# Patient Record
Sex: Female | Born: 1970
Health system: Southern US, Community
[De-identification: ages and names within clinical notes are randomized; demographics above are authoritative.]

## PROBLEM LIST (undated history)

## (undated) DIAGNOSIS — T7840XA Allergy, unspecified, initial encounter: Secondary | ICD-10-CM

## (undated) DIAGNOSIS — F1910 Other psychoactive substance abuse, uncomplicated: Secondary | ICD-10-CM

## (undated) DIAGNOSIS — F419 Anxiety disorder, unspecified: Secondary | ICD-10-CM

## (undated) DIAGNOSIS — E119 Type 2 diabetes mellitus without complications: Secondary | ICD-10-CM

## (undated) DIAGNOSIS — K311 Adult hypertrophic pyloric stenosis: Secondary | ICD-10-CM

## (undated) DIAGNOSIS — E669 Obesity, unspecified: Secondary | ICD-10-CM

## (undated) HISTORY — DX: Other psychoactive substance abuse, uncomplicated: F19.10

## (undated) HISTORY — DX: Anxiety disorder, unspecified: F41.9

## (undated) HISTORY — DX: Obesity, unspecified: E66.9

## (undated) HISTORY — DX: Allergy, unspecified, initial encounter: T78.40XA

## (undated) HISTORY — DX: Adult hypertrophic pyloric stenosis: K31.1

---
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
## 2002-05-28 ENCOUNTER — Encounter: Admission: RE | Admit: 2002-05-28 | Discharge: 2002-05-28 | Payer: Self-pay | Admitting: Family Medicine

## 2002-05-28 ENCOUNTER — Encounter: Payer: Self-pay | Admitting: Family Medicine

## 2002-05-31 ENCOUNTER — Encounter (INDEPENDENT_AMBULATORY_CARE_PROVIDER_SITE_OTHER): Payer: Self-pay | Admitting: Internal Medicine

## 2003-01-12 ENCOUNTER — Other Ambulatory Visit: Admission: RE | Admit: 2003-01-12 | Discharge: 2003-01-12 | Payer: Self-pay | Admitting: Internal Medicine

## 2005-01-02 ENCOUNTER — Ambulatory Visit: Payer: Self-pay | Admitting: Internal Medicine

## 2006-10-09 ENCOUNTER — Ambulatory Visit: Payer: Self-pay | Admitting: Internal Medicine

## 2006-10-22 ENCOUNTER — Encounter: Payer: Self-pay | Admitting: Internal Medicine

## 2007-05-26 ENCOUNTER — Telehealth (INDEPENDENT_AMBULATORY_CARE_PROVIDER_SITE_OTHER): Payer: Self-pay | Admitting: *Deleted

## 2007-11-13 ENCOUNTER — Ambulatory Visit: Payer: Self-pay | Admitting: Family Medicine

## 2007-11-13 DIAGNOSIS — F17210 Nicotine dependence, cigarettes, uncomplicated: Secondary | ICD-10-CM | POA: Insufficient documentation

## 2007-11-13 DIAGNOSIS — R062 Wheezing: Secondary | ICD-10-CM | POA: Insufficient documentation

## 2007-11-13 DIAGNOSIS — F17200 Nicotine dependence, unspecified, uncomplicated: Secondary | ICD-10-CM | POA: Insufficient documentation

## 2007-11-18 ENCOUNTER — Telehealth (INDEPENDENT_AMBULATORY_CARE_PROVIDER_SITE_OTHER): Payer: Self-pay | Admitting: Internal Medicine

## 2007-11-20 ENCOUNTER — Telehealth (INDEPENDENT_AMBULATORY_CARE_PROVIDER_SITE_OTHER): Payer: Self-pay | Admitting: *Deleted

## 2008-03-30 ENCOUNTER — Telehealth: Payer: Self-pay | Admitting: Family Medicine

## 2008-03-31 ENCOUNTER — Ambulatory Visit: Payer: Self-pay | Admitting: Internal Medicine

## 2008-03-31 DIAGNOSIS — F438 Other reactions to severe stress: Secondary | ICD-10-CM | POA: Insufficient documentation

## 2008-07-28 ENCOUNTER — Ambulatory Visit: Payer: Self-pay | Admitting: Internal Medicine

## 2009-02-22 ENCOUNTER — Ambulatory Visit: Payer: Self-pay | Admitting: Internal Medicine

## 2009-02-22 DIAGNOSIS — J039 Acute tonsillitis, unspecified: Secondary | ICD-10-CM | POA: Insufficient documentation

## 2009-12-12 ENCOUNTER — Ambulatory Visit: Payer: Self-pay | Admitting: Family Medicine

## 2009-12-12 ENCOUNTER — Encounter: Payer: Self-pay | Admitting: Internal Medicine

## 2009-12-20 ENCOUNTER — Ambulatory Visit: Payer: Self-pay | Admitting: Internal Medicine

## 2009-12-20 DIAGNOSIS — R9402 Abnormal brain scan: Secondary | ICD-10-CM | POA: Insufficient documentation

## 2009-12-20 DIAGNOSIS — R9409 Abnormal results of other function studies of central nervous system: Secondary | ICD-10-CM | POA: Insufficient documentation

## 2009-12-20 DIAGNOSIS — F411 Generalized anxiety disorder: Secondary | ICD-10-CM | POA: Insufficient documentation

## 2009-12-22 ENCOUNTER — Ambulatory Visit: Payer: Self-pay | Admitting: Internal Medicine

## 2009-12-22 ENCOUNTER — Encounter: Payer: Self-pay | Admitting: Internal Medicine

## 2010-09-20 ENCOUNTER — Ambulatory Visit: Payer: Self-pay | Admitting: Internal Medicine

## 2010-09-20 ENCOUNTER — Encounter: Payer: Self-pay | Admitting: Family Medicine

## 2010-09-20 DIAGNOSIS — J209 Acute bronchitis, unspecified: Secondary | ICD-10-CM | POA: Insufficient documentation

## 2010-12-11 NOTE — Assessment & Plan Note (Signed)
Summary: COUGH,FEVER/CLE   Vital Signs:  Patient profile:   40 year old female Height:      64 inches Weight:      265.25 pounds BMI:     45.69 O2 Sat:      97 % on Room air Temp:     99.1 degrees F tympanic Pulse rate:   80 / minute Pulse rhythm:   regular BP sitting:   118 / 80  (left arm) Cuff size:   large  Vitals Entered By: Selena Batten Dance CMA (AAMA) (September 20, 2010 2:07 PM)  O2 Flow:  Room air CC: Cough/fever   History of Present Illness: CC: cough/ congestion   ~2wk h/o ST, nasal congestion which has since cleared.  Now with 4-5 d h/o bad cough.  Dry.  + chest tightness.  tried mucinex, robitussin, not helping. + headache (throbbing not sinus pressure) and shoulder/neck pain from coughing.  initially L>R ear pain.  Thinks wheezing at night.  No RN.  No fevers/chills, abd pain, n/v/d, rashes, myalgia.  No sick contacts at home.  no h/o asthma, never been told has COPD.  + smoking 1 1/2 ppd.  now down to 1 ppd.  Current Medications (verified): 1)  Vitamin C 500 Mg  Chew (Ascorbic Acid) .... Take 1 Tablet By Mouth Once A Day As Needed 2)  Lorazepam 0.5 Mg  Tabs (Lorazepam) .... 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  coughing, tired, nontoxic Head:  Normocephalic and atraumatic without obvious abnormalities. No apparent alopecia or balding.  no sinus tenderness Eyes:  No corneal or conjunctival inflammation noted. EOMI. Perrla.  Ears:  TMs clear, some fluid behind L TM Nose:  moderate inflammation, + discharge R nare Mouth:  MMM, no pharyngeal erythema/exudates Neck:  No deformities,  masses, or tenderness noted. Lungs:  normal respiratory effort and normal breath sounds.  + cough.  no wheezing Heart:  Normal rate and regular rhythm. S1 and S2 normal without gallop, murmur, click, rub or other extra sounds. Pulses:  2+ rad pulses Skin:  Intact without suspicious lesions or rashes   Impression & Recommendations:  Problem # 1:  ACUTE BRONCHITIS (ICD-466.0) Take antibiotics and other medications as directed. Encouraged to push clear liquids, get enough rest, and take acetaminophen as needed. To be seen in 5-7 days if no improvement, sooner if worse.  zpack, cheratussin, albuterol.  rec cut back smoking when on albuterol  Her updated medication list for this problem includes:    Zithromax Z-pak 250 Mg Tabs (Azithromycin) .Marland Kitchen... Take as directed    Ventolin Hfa 108 (90 Base) Mcg/act Aers (Albuterol sulfate) .Marland Kitchen... 2 puffs q4-6 hours as needed wheeze/sob    Cheratussin Ac 100-10 Mg/24ml Syrp (Guaifenesin-codeine) ..... One teaspoon q4-6 hours as needed cough  Problem # 2:  TOBACCO ABUSE (ICD-305.1)  Encouraged smoking cessation  Complete Medication List: 1)  Vitamin C 500 Mg Chew (Ascorbic acid) .... Take 1 tablet by mouth once a day as needed 2)  Lorazepam 0.5 Mg Tabs (Lorazepam) .... 1/2- 1 tab two times a day and at bedtime as needed to help nerves  and sleep 3)  Zithromax Z-pak 250 Mg Tabs (Azithromycin) .... Take as directed 4)  Ventolin Hfa 108 (90 Base) Mcg/act Aers (Albuterol sulfate) .... 2 puffs q4-6 hours as needed wheeze/sob 5)  Cheratussin Ac 100-10 Mg/24ml Syrp (Guaifenesin-codeine) .... One teaspoon q4-6 hours as needed cough  Patient Instructions: 1)  Sounds like you have a bronchitis.   2)  The cough can last up to 4 weeks to go away. 3)  Use medication as prescribed: zpack.  cheratussin.  albuterol inhaler. 4)  Push fluids and rest. 5)  Please return if you are not improving as expected, or if you have high fevers (>101.5) or difficulty breathing. 6)  Call  clinic with questions.  Pleasure to see you today!  Prescriptions: CHERATUSSIN AC 100-10 MG/5ML SYRP (GUAIFENESIN-CODEINE) one teaspoon q4-6 hours as needed cough  #100cc x 0   Entered and Authorized by:   Eustaquio Boyden  MD   Signed by:   Eustaquio Boyden  MD on 09/20/2010   Method used:   Print then Give to Patient   RxID:   [id number] VENTOLIN HFA 108 (90 BASE) MCG/ACT AERS (ALBUTEROL SULFATE) 2 puffs q4-6 hours as needed wheeze/sob  #1 x 1   Entered and Authorized by:   Eustaquio Boyden  MD   Signed by:   Eustaquio Boyden  MD on 09/20/2010   Method used:   Electronically to        [facility]  [address] [phone]* (retail)       [address]       [place], [place]  [address]       Ph: [phone] or [phone]       Fax: [phone]   RxID:   [id number] ZITHROMAX Z-PAK 250 MG TABS (AZITHROMYCIN) take as directed  #1 x 0   Entered and Authorized by:   Eustaquio Boyden  MD   Signed by:   Eustaquio Boyden  MD on 09/20/2010   Method used:   Electronically to        [facility]  [address] [phone]* (retail)       [address]       [place], [place]  [address]       Ph: [phone] or [phone]       Fax: [phone]   RxID:   [id number]    Orders Added: 1)  Est. Patient Level III [71062]    Current Allergies (reviewed today): No known allergies

## 2010-12-11 NOTE — Assessment & Plan Note (Signed)
Summary: FOLLOW UP ON CT HEAD/[facility]   Vital Signs:  Patient profile:   40 year old female Weight:      249 pounds BMI:     42.90 Temp:     98.4 degrees F oral Pulse rate:   68 / minute Pulse rhythm:   regular BP sitting:   102 / 78  (left arm) Cuff size:   large  Vitals Entered By: Mervin Hack CMA Duncan Dull) (December 20, 2009 11:21 AM) CC: follow-up visit   History of Present Illness: Got hit on head by wheelchair leg while bending down helping someone who fell on floor No loss of conciousness Occurred 2 weeks ago Got a big goose egg--did ice  (right temporoccipital area)  Has had headaches since then Dr Elease Hashimoto ordered CT scan last week Abnormality seen on CT that was not related to the trauma ??prior stroke  Diagnosed with concussion Rx flexeril and naprosyn--they help but can only take flexeril at night (and that is what is really helping)  Allergies: No Known Drug Allergies  Past History:  Past medical, surgical, family and social histories (including risk factors) reviewed for relevance to current acute and chronic problems.  Past Medical History: Anxiety  Past Surgical History: Reviewed history from 03/31/2008 and no changes required. 1972 Pyloric stenosis repair 1989 C-section VD x 3 7/03 MRI brain--normal  Family History: Reviewed history from 07/28/2008 and no changes required. Mom died @50  kidney cancer Mat GM had breast cancer Dad with skin cancer & non-Hodgkins lymphoma pat aunt with breast cancer also No CAD, HTN DM in mat GGM No colon cancer  Social History: Reviewed history from 03/31/2008 and no changes required. 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   Psych:  normally interactive, good eye contact, not anxious appearing, and not depressed appearing.      Impression & Recommendations:  Problem # 1:  UNSPEC ABNORM FUNCT STUDY BRAIN&CNTRL NERV SYS (ICD-794.00) Assessment New  CT shows lucency in left lenticular nucleus Discussed with Dr Desma Maxim feels this could be benign process like cyst but could also be lacunar infarct recommended proceeding with MRI with contrast  discussed with patient If it is infarct, would need asa, statin, etc will go ahead with study  Orders: Radiology Referral (Radiology)  Complete Medication List: 1)  Vitamin C 500 Mg Chew (Ascorbic acid) .... Take 1 tablet by mouth once a day as needed 2)  Lorazepam 0.5 Mg Tabs (Lorazepam) .... 1/2- 1 tab two times a day and at bedtime as needed to help nerves and sleep 3)  Flexeril 5 Mg Tabs (Cyclobenzaprine hcl) .... Take 1 by mouth two times a day 4)  Naprosyn 500 Mg Tabs (Naproxen) .... Take 1 by mouth two times a day  Patient Instructions: 1)  Please schedule a follow-up appointment as needed .  2)  Please set up brain MRI  Current Allergies (reviewed today): No known allergies

## 2010-12-11 NOTE — Consult Note (Signed)
Summary: Dr.Michael Reynolds,[facility]Note  Dr.Michael Reynolds,[facility]Note   Imported By: Beau Fanny 12/21/2009 08:46:17  _____________________________________________________________________  External Attachment:    Type:   Image     Comment:   External Document

## 2010-12-11 NOTE — Letter (Signed)
Summary: Out of Work  [employer] at [facility]  [address] [place], [place] [address]   Phone: [phone]  Fax: [phone]    September 20, 2010   Employee:  Suad Bally    To Whom It May Concern:   For Medical reasons, please excuse the above named employee from work for the following dates:  Start:  September 20, 2010   End:  September 21, 2010   If you need additional information, please feel free to contact our office.         Sincerely,    Eustaquio Boyden  MD

## 2010-12-11 NOTE — Consult Note (Signed)
Summary: Dr.E.Hines,[facility],Note  Dr.E.Hines,[facility],Note   Imported By: Beau Fanny 12/21/2009 08:49:41  _____________________________________________________________________  External Attachment:    Type:   Image     Comment:   External Document

## 2012-05-08 ENCOUNTER — Ambulatory Visit (INDEPENDENT_AMBULATORY_CARE_PROVIDER_SITE_OTHER): Payer: BC Managed Care – PPO | Admitting: Internal Medicine

## 2012-05-08 ENCOUNTER — Encounter: Payer: Self-pay | Admitting: Internal Medicine

## 2012-05-08 ENCOUNTER — Telehealth: Payer: Self-pay | Admitting: Internal Medicine

## 2012-05-08 VITALS — BP 110/60 | HR 87 | Temp 97.8°F | Wt 287.0 lb

## 2012-05-08 DIAGNOSIS — I889 Nonspecific lymphadenitis, unspecified: Secondary | ICD-10-CM

## 2012-05-08 MED ORDER — AMOXICILLIN-POT CLAVULANATE 875-125 MG PO TABS: 1.0000 | ORAL_TABLET | Freq: Two times a day (BID) | ORAL | Status: AC

## 2012-05-08 NOTE — Telephone Encounter (Signed)
Will evaluate then 

## 2012-05-08 NOTE — Patient Instructions (Signed)
Please call for referral to ENT doctor if your swollen gland persists after the antibiotics

## 2012-05-08 NOTE — Assessment & Plan Note (Signed)
Has been ongoing for a couple of months No obvious source of infection No oral lesions Will treat with augmentin--if doesn't resolve, will proceed with ENT eval

## 2012-05-08 NOTE — Telephone Encounter (Signed)
LMP: Irregular d/t IUD Caller: Dawn Zavala/Patient; PCP: Tillman Abide; CB#: [phone];  Call regarding Sore Throat on R side and gland under R ear is sore- onset 05/06/12 - Same sx reoccurring every 2 weeks for past few months; Low grade fever with first incidence 3 months ago. Took Ibuprofen 2 days ago when sx started- none since. White patches in throat 2 weeks ago but this time just looks swollen no redness. Nausea on and off. Son had strep in May 2013 and was treated. Triage and Care advice per Sore Throat Protocol and appnt advised within 24 hours for 'New onset painful swollen glands on sides of neck or under jaw". Appnt scheduled for 1445 05/08/12 with Dr. Alphonsus Sias.

## 2012-05-08 NOTE — Progress Notes (Signed)
  Subjective:    Patient ID: Dawn Zavala, female    DOB: 1970-12-04, 41 y.o.   MRN: [id number]  HPI Given 1-800 QUIT NOW info  Having intermittent swelling on outside of right neck Some pain with this Goes back for at least 2 months  Fever at first--not since No cough--or rare related to cigarettes No nasal congestion, post nasal drip No change in voice  Gargling with warm salt water eases pain  No current outpatient prescriptions on file prior to visit.    No Known Allergies  Past Medical History  Diagnosis Date  . Anxiety   . Pyloric stenosis 1972    repair    Past Surgical History  Procedure Date  . Cesarean section   . Vaginal delivery     x3    Family History  Problem Relation Age of Onset  . Heart disease Neg Hx   . Hypertension Neg Hx     History   Social History  . Marital Status: Single    Spouse Name: N/A    Number of Children: 4  . Years of Education: N/A   Occupational History  . RN    Social History Main Topics  . Smoking status: Current Everyday Smoker  . Smokeless tobacco: Never Used  . Alcohol Use: No  . Drug Use: No  . Sexually Active: Not on file   Other Topics Concern  . Not on file   Social History Narrative  . No narrative on file     Review of Systems No oral lesions Right ear is mildly tender No vomiting---some past nausea    Objective:   Physical Exam  Constitutional: She appears well-developed and well-nourished. No distress.  HENT:  Mouth/Throat: Oropharynx is clear and moist. No oropharyngeal exudate.       No sinus tenderness No sig nasal inflammation TMs normal  Neck: Normal range of motion. Neck supple.       Single tender anterior cervical node on right  Pulmonary/Chest: Effort normal and breath sounds normal. No respiratory distress. She has no wheezes. She has no rales.  Lymphadenopathy:    She has cervical adenopathy.          Assessment & Plan:

## 2013-03-30 ENCOUNTER — Encounter: Payer: Self-pay | Admitting: Internal Medicine

## 2013-03-30 ENCOUNTER — Ambulatory Visit (INDEPENDENT_AMBULATORY_CARE_PROVIDER_SITE_OTHER): Payer: BC Managed Care – PPO | Admitting: Internal Medicine

## 2013-03-30 VITALS — BP 100/70 | HR 82 | Temp 98.7°F | Ht 64.0 in | Wt 277.5 lb

## 2013-03-30 DIAGNOSIS — J019 Acute sinusitis, unspecified: Secondary | ICD-10-CM

## 2013-03-30 MED ORDER — AMOXICILLIN 500 MG PO TABS: 1000.0000 mg | ORAL_TABLET | Freq: Two times a day (BID) | ORAL | Status: DC

## 2013-03-30 MED ORDER — HYDROCODONE-HOMATROPINE 5-1.5 MG/5ML PO SYRP: 5.0000 mL | ORAL_SOLUTION | Freq: Every evening | ORAL | Status: DC | PRN

## 2013-03-30 NOTE — Patient Instructions (Signed)
Please start the antibiotic if you worsen, or if you are not improving by next week

## 2013-03-30 NOTE — Assessment & Plan Note (Signed)
Clear sinus symptoms but probably viral Discussed supportive care If worsens, start the amoxicillin

## 2013-03-30 NOTE — Progress Notes (Signed)
  Subjective:    Patient ID: Dawn Zavala, female    DOB: 08-07-1971, 42 y.o.   MRN: [id number]  HPI Having sinus problems Has felt sick for 5 days Really worse in past 2-3 days Intermittent right ear pain Cough-- some mucus stuck in throat Feels post nasal drip Some rhinorrhea Bad frontal pressure  No fever Some SOB---with this illness No wheezing Not ready to stop smoking---has been trying e-cigarettes  Used some old cough syrup last night Cough drops  No current outpatient prescriptions on file prior to visit.   No current facility-administered medications on file prior to visit.    No Known Allergies  Past Medical History  Diagnosis Date  . Anxiety   . Pyloric stenosis 1972    repair    Past Surgical History  Procedure Laterality Date  . Cesarean section    . Vaginal delivery      x3    Family History  Problem Relation Age of Onset  . Heart disease Neg Hx   . Hypertension Neg Hx     History   Social History  . Marital Status: Single    Spouse Name: N/A    Number of Children: 4  . Years of Education: N/A   Occupational History  . RN     [employer] [place]   Social History Main Topics  . Smoking status: Current Every Day Smoker  . Smokeless tobacco: Never Used  . Alcohol Use: No  . Drug Use: No  . Sexually Active: Not on file   Other Topics Concern  . Not on file   Social History Narrative  . No narrative on file   Review of Systems     Objective:   Physical Exam  Constitutional: She appears well-developed and well-nourished. No distress.  HENT:  Mouth/Throat: Oropharynx is clear and moist. No oropharyngeal exudate.  Mild maxillary tenderness Moderate nasal inflammation TMs normal  Neck: Normal range of motion. Neck supple.  Pulmonary/Chest: Effort normal and breath sounds normal. No respiratory distress. She has no wheezes. She has no rales.  Lymphadenopathy:    She has no cervical adenopathy.          Assessment  & Plan:

## 2013-04-02 ENCOUNTER — Ambulatory Visit (INDEPENDENT_AMBULATORY_CARE_PROVIDER_SITE_OTHER): Payer: BC Managed Care – PPO | Admitting: Family Medicine

## 2013-04-02 ENCOUNTER — Telehealth: Payer: Self-pay | Admitting: Internal Medicine

## 2013-04-02 ENCOUNTER — Encounter: Payer: Self-pay | Admitting: Family Medicine

## 2013-04-02 VITALS — BP 110/72 | HR 80 | Temp 98.1°F | Ht 64.0 in | Wt 277.0 lb

## 2013-04-02 DIAGNOSIS — T7840XA Allergy, unspecified, initial encounter: Secondary | ICD-10-CM

## 2013-04-02 NOTE — Telephone Encounter (Signed)
Patient Information:  Caller Name: Jake  Phone: [phone]  Patient: Dawn Zavala, Dawn Zavala  Gender: Female  DOB: 1971-10-13  Age: 42 Years  PCP: Tillman Abide [facility])  Pregnant: No  Office Follow Up:  Does the office need to follow up with this patient?: No  Instructions For The Office: N/A  RN Note:  Pt denies any worse breathing symptoms than when she was last evaluated.  Symptoms  Reason For Call & Symptoms: pt is calling to report that she might be having an allergic reaction to cough syrup given.  Pt has had 1 dose last night of Hycodan and this am she has hives all over, she is itching and her bottom lip is numb  Reviewed Health History In EMR: Yes  Reviewed Medications In EMR: Yes  Reviewed Allergies In EMR: Yes  Reviewed Surgeries / Procedures: Yes  Date of Onset of Symptoms: 04/02/2013  Treatments Tried: claritin  Treatments Tried Worked: No OB / GYN:  LMP: Unknown  Guideline(s) Used:  Hives  Rash - Widespread on Drugs - Drug Reaction  Disposition Per Guideline:   See Today in Office  Reason For Disposition Reached:   Hives  Advice Given:  Oral Antihistamine Medication for Itching:  An over-the-counter antihistamine that causes less sleepiness is loratadine (e.g., Alavert or Claritin).  Patient Will Follow Care Advice:  YES  Appointment Scheduled:  04/02/2013 12:00:00 Appointment Scheduled Provider:  Kerby Nora [facility])  (pt is coming from work at [employer], need appt at this time; Dr Alphonsus Sias is unavailable)

## 2013-04-02 NOTE — Assessment & Plan Note (Signed)
Stop hycodan. Will treat with steroid injection given oral involvement. Antihistamines.

## 2013-04-02 NOTE — Progress Notes (Signed)
  Subjective:    Patient ID: Dawn Zavala, female    DOB: 1971-10-27, 42 y.o.   MRN: [id number]  HPI  42 year old female with recent diagnosis of acute sinusitis 03/30/2013 .Marland Kitchen Recommended symptomatic care. Did not start antibiotics.  Pt took a dose of Hycodan yesterday... Woke in nght with right lower lip swelling and hives on arms and abdomen. No change in breathing, No difficulty swallowing. Took claritin today.    Review of Systems  Constitutional: Negative for fever and fatigue.  HENT: Negative for ear pain.   Eyes: Negative for pain.  Respiratory: Negative for chest tightness and shortness of breath.   Cardiovascular: Negative for chest pain, palpitations and leg swelling.  Gastrointestinal: Negative for abdominal pain.  Genitourinary: Negative for dysuria.       Objective:   Physical Exam  Constitutional: Vital signs are normal. She appears well-developed and well-nourished. She is cooperative.  Non-toxic appearance. She does not appear ill. No distress.  HENT:  Head: Normocephalic.  Right Ear: Hearing, tympanic membrane, external ear and ear canal normal. Tympanic membrane is not erythematous, not retracted and not bulging.  Left Ear: Hearing, tympanic membrane, external ear and ear canal normal. Tympanic membrane is not erythematous, not retracted and not bulging.  Nose: No mucosal edema or rhinorrhea. Right sinus exhibits no maxillary sinus tenderness and no frontal sinus tenderness. Left sinus exhibits no maxillary sinus tenderness and no frontal sinus tenderness.  Mouth/Throat: Uvula is midline, oropharynx is clear and moist and mucous membranes are normal.  Right lower lip swelling  Eyes: Conjunctivae, EOM and lids are normal. Pupils are equal, round, and reactive to light. No foreign bodies found.  Neck: Trachea normal and normal range of motion. Neck supple. Carotid bruit is not present. No mass and no thyromegaly present.  Cardiovascular: Normal rate, regular  rhythm, S1 normal, S2 normal, normal heart sounds, intact distal pulses and normal pulses.  Exam reveals no gallop and no friction rub.   No murmur heard. Pulmonary/Chest: Effort normal and breath sounds normal. Not tachypneic. No respiratory distress. She has no decreased breath sounds. She has no wheezes. She has no rhonchi. She has no rales.  Abdominal: Soft. Normal appearance and bowel sounds are normal. There is no tenderness.  Neurological: She is alert.  Skin: Skin is warm, dry and intact. Rash noted. Rash is macular and urticarial. Rash is not papular, not nodular, not pustular and not vesicular.  On arms, breasts, scalp face and abdomen  Psychiatric: Her speech is normal and behavior is normal. Judgment and thought content normal. Her mood appears not anxious. Cognition and memory are normal. She does not exhibit a depressed mood.          Assessment & Plan:

## 2013-04-02 NOTE — Patient Instructions (Addendum)
Given steroid injection today. Use claritin during the day and benadryl at night.  Got to ER if any shortness of breath or oral swelling worsening and causing difficult swallowing or breathing.

## 2013-04-04 ENCOUNTER — Ambulatory Visit: Payer: Self-pay | Admitting: Family Medicine

## 2013-04-06 ENCOUNTER — Telehealth: Payer: Self-pay

## 2013-04-06 NOTE — Telephone Encounter (Signed)
Triage Record Num: [id number] Operator: Patriciaann Clan Patient Name: Dawn Zavala Call Date & Time: 04/04/2013 11:31:38AM Patient Phone: [phone] PCP: Tillman Abide Patient Gender: Female PCP Fax : [phone] Patient DOB: Jan 11, 1971 Practice Name: [facility] Reason for Call: Caller: [name]; PCP: Tillman Abide (Family Practice); CB#: [phone]; Call regarding Rash/Hives; LMP: Has Mirena IUD. Patient states she was seen in the office 03/30/13 for cough, congestion and prescribed prescribed Hycodan for cough. Patient states she developed hived 04/02/13. States she was seen in the office 04/02/13 for hives. States seh was advised to discontinue Hycodan. Patient states she was given a steroid injection and advised to take Claritin during the day and Benadryl at night. Patient states hives have increased 04/04/13. Patient describes generalized hives/itching. States hives are present "all over" including scalp. States enlarged/tender lymph nodes noted at base of scalp. Patient had Claritin at 0530 04/04/13 with no relief. No difficulty breathing or swallowing. No wheezing. No swelling of lips, tongue or face. Afebrile. Triage per Hives Protocol. No emergent sx identfied. Disposition of " Call Provider within 4 hours" related to positive triage assessment for " New onset of hives after beginning new prescribed medication." Care advice given per guidelines. Patient advised to be evaluated now in the Urgent Care. Patient advised not to drive self. Patient verbalizes understanding and agreeable. Patient states she will go to an Urgent Care in [facility] (due to location) for evaluation. Protocol(s) Used: Hives Recommended Outcome per Protocol: Call Provider within 4 Hours Override Outcome if Used in Protocol: See Provider within 4 hours RN Reason for Override Outcome: Nursing Judgement Used. Reason for Outcome: New onset of hives after beginning  new prescribed, nonprescribed, or alternative/complementary medication Care Advice: ~ IMMEDIATE ACTION Call EMS 911 if develop signs and symptoms of anaphylaxis within minutes to several hours of exposure: severe difficulty breathing; rapid, weak or irregular pulse; pruritus, urticaria, swelling of face, lips, tongue, or throat causing tightness or difficulty swallowing; abdominal cramping, nausea, vomiting or diarrhea. ~

## 2013-04-06 NOTE — Telephone Encounter (Signed)
Please check on her today

## 2013-04-07 NOTE — Telephone Encounter (Signed)
Pt went to UC on Sunday, was prescribed z-pak, per pt she is allergic to Hycodan, pt is still concerned about the cough, she broke out in hives after getting a solu-medrol shot

## 2013-04-07 NOTE — Telephone Encounter (Signed)
Pt left v/m requesting call back [phone].

## 2013-04-07 NOTE — Telephone Encounter (Signed)
Left message on cell VM asking pt how she was doing, advised pt to return my call

## 2013-04-08 ENCOUNTER — Telehealth: Payer: Self-pay | Admitting: Internal Medicine

## 2013-04-08 ENCOUNTER — Emergency Department: Payer: Self-pay | Admitting: Emergency Medicine

## 2013-04-08 LAB — COMPREHENSIVE METABOLIC PANEL
Alkaline Phosphatase: 89 U/L (ref 50–136)
Chloride: 105 mmol/L (ref 98–107)
Co2: 23 mmol/L (ref 21–32)
Creatinine: 0.94 mg/dL (ref 0.60–1.30)
EGFR (African American): >60
EGFR (Non-African Amer.): >60
Glucose: 163 mg/dL — ABNORMAL HIGH (ref 65–99)
Osmolality: 276 (ref 275–301)
SGPT (ALT): 27 U/L (ref 12–78)

## 2013-04-08 LAB — URINALYSIS, COMPLETE
Bilirubin,UR: NEGATIVE
Blood: NEGATIVE
Ketone: NEGATIVE
Nitrite: NEGATIVE
Ph: 5 (ref 4.5–8.0)
Protein: NEGATIVE
RBC,UR: 1 /HPF (ref 0–5)
Specific Gravity: 1.016 (ref 1.003–1.030)

## 2013-04-08 LAB — CBC
HCT: 39 % (ref 35.0–47.0)
MCHC: 35.6 g/dL (ref 32.0–36.0)
MCV: 89 fL (ref 80–100)
Platelet: 346 10*3/uL (ref 150–440)
WBC: 11.2 10*3/uL — ABNORMAL HIGH (ref 3.6–11.0)

## 2013-04-08 LAB — LIPASE, BLOOD: Lipase: 116 U/L (ref 73–393)

## 2013-04-08 NOTE — Telephone Encounter (Signed)
Caller: Dawn Zavala/Patient; Phone: [phone]; Reason for Call: Caller states she was seen in the Emergency Department AM 04/08/13- [facility].  Caller states they believe her abdominal pain is from her gallbladder.  She was advised to follow up with Dr Alphonsus Sias for further testing.  Caller states she wonders if the pain could be from the steroids she was taking.  Caller does not wish to schedule follow up appointment at this time and would like to know what Dr Alphonsus Sias thinks about the gallbladder and steroids. Abdominal pain is manageable currently per caller.  PLEASE ADVISE.

## 2013-04-08 NOTE — Telephone Encounter (Signed)
Unusual to have hives to hycodan and then the solumedrol. Not sure what the reaction is to.  If she needs something else for cough, okay to Rx benzonatate 200mg  tid prn #40 x 0

## 2013-04-08 NOTE — Telephone Encounter (Signed)
Did they do an ultrasound? Please get the records  I haven't heard of the steroids acting up a gallbladder problem but I guess it is possible. If she hasn't had an ultrasound, I would order one without another appt to evaluate her gallbladder.  If she had it, I will review it when I get it and let her know my recommendations

## 2013-04-09 NOTE — Telephone Encounter (Signed)
Let her know that I did review the records and her ultrasound was pretty much normal (some mild fatty changes in the liver that were not concerning and unlikely to cause pain)  I don't think we need to do anything else unless she has recurrent bouts with the abdominal pain

## 2013-04-09 NOTE — Telephone Encounter (Signed)
.  left message to have patient return my call.  

## 2013-04-09 NOTE — Telephone Encounter (Signed)
Spoke with patient and she did get a ultrasound, I will fax over and get the results

## 2013-04-09 NOTE — Telephone Encounter (Signed)
Hospital records on your desk

## 2013-04-14 MED ORDER — BENZONATATE 200 MG PO CAPS: 200.0000 mg | ORAL_CAPSULE | Freq: Three times a day (TID) | ORAL | Status: DC | PRN

## 2013-04-14 NOTE — Telephone Encounter (Signed)
Spoke with patient and advised results rx sent to pharmacy by e-script  

## 2013-04-14 NOTE — Addendum Note (Signed)
Addended by: Sueanne Margarita on: 04/14/2013 10:17 AM   Modules accepted: Orders

## 2013-04-14 NOTE — Telephone Encounter (Signed)
Spoke with patient and advised results   

## 2013-09-16 ENCOUNTER — Other Ambulatory Visit: Payer: Self-pay

## 2014-08-01 ENCOUNTER — Ambulatory Visit (INDEPENDENT_AMBULATORY_CARE_PROVIDER_SITE_OTHER): Payer: BC Managed Care – PPO | Admitting: Internal Medicine

## 2014-08-01 ENCOUNTER — Encounter: Payer: Self-pay | Admitting: Internal Medicine

## 2014-08-01 ENCOUNTER — Encounter: Payer: Self-pay | Admitting: *Deleted

## 2014-08-01 VITALS — BP 102/71 | HR 80 | Temp 98.0°F | Wt 271.0 lb

## 2014-08-01 DIAGNOSIS — B9789 Other viral agents as the cause of diseases classified elsewhere: Secondary | ICD-10-CM

## 2014-08-01 DIAGNOSIS — B349 Viral infection, unspecified: Secondary | ICD-10-CM

## 2014-08-01 LAB — POCT INFLUENZA A/B
Influenza A, POC: NEGATIVE
Influenza B, POC: NEGATIVE

## 2014-08-01 NOTE — Progress Notes (Signed)
   Subjective:    Patient ID: Dawn Zavala, female    DOB: 08-14-1971, 43 y.o.   MRN: [id number]  HPI Started with headache at work 2 days ago Then had bad aching in whole body Fever up to 101.9 with night sweats Joints aching Headache  Slight cough at work--not since Some SOB--only with activity  No rash No apparent tick exposure Tried ibuprofen--some help with headache  No current outpatient prescriptions on file prior to visit.   No current facility-administered medications on file prior to visit.    No Known Allergies  Past Medical History  Diagnosis Date  . Anxiety   . Pyloric stenosis 1972    repair    Past Surgical History  Procedure Laterality Date  . Cesarean section    . Vaginal delivery      x3    Family History  Problem Relation Age of Onset  . Heart disease Neg Hx   . Hypertension Neg Hx     History   Social History  . Marital Status: Single    Spouse Name: N/A    Number of Children: 4  . Years of Education: N/A   Occupational History  . 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 Smoking status: Current Every Day Smoker  . Smokeless tobacco: Never Used  . Alcohol Use: No  . Drug Use: No  . Sexual Activity: Not on file   Other Topics Concern  . Not on file   Social History Narrative  . No narrative on file   Review of Systems Some nausea without vomiting Making herself eat No diarrhea No sore throat No dysuria or hematuria Trying to cut back on smoking--discussed    Objective:   Physical Exam  Constitutional:  Looks tired but no distress  HENT:  Mouth/Throat: Oropharynx is clear and moist. No oropharyngeal exudate.  No sinus tenderness Mild nasal congestion TMs normal  Neck: Normal range of motion. Neck supple. No thyromegaly present.  Pulmonary/Chest: Effort normal and breath sounds normal. No respiratory distress. She has no wheezes. She has no rales.  Abdominal: Soft. There is no  tenderness.  Lymphadenopathy:    She has no cervical adenopathy.          Assessment & Plan:

## 2014-08-01 NOTE — Progress Notes (Signed)
Pre visit review using our clinic review tool, if applicable. No additional management support is needed unless otherwise documented below in the visit note. 

## 2014-08-01 NOTE — Assessment & Plan Note (Addendum)
Classic viral syndrome Only very mild respiratory component but works in [employer] so consideration of flu very important Since rapid test normal--no Rx Discussed supportive care Out of work till fever gone--according to facility requirements

## 2014-08-26 ENCOUNTER — Other Ambulatory Visit: Payer: Self-pay

## 2015-02-15 ENCOUNTER — Telehealth: Payer: Self-pay | Admitting: Primary Care

## 2015-02-15 ENCOUNTER — Ambulatory Visit (INDEPENDENT_AMBULATORY_CARE_PROVIDER_SITE_OTHER): Payer: BLUE CROSS/BLUE SHIELD | Admitting: Primary Care

## 2015-02-15 ENCOUNTER — Ambulatory Visit (INDEPENDENT_AMBULATORY_CARE_PROVIDER_SITE_OTHER)
Admission: RE | Admit: 2015-02-15 | Discharge: 2015-02-15 | Disposition: A | Discharge status: Home / Self Care | Payer: BLUE CROSS/BLUE SHIELD | Source: Ambulatory Visit | Attending: Primary Care | Admitting: Primary Care

## 2015-02-15 ENCOUNTER — Encounter: Payer: Self-pay | Admitting: Primary Care

## 2015-02-15 VITALS — BP 118/78 | HR 63 | Temp 98.2°F | Ht 64.0 in | Wt 274.1 lb

## 2015-02-15 DIAGNOSIS — R059 Cough, unspecified: Secondary | ICD-10-CM

## 2015-02-15 DIAGNOSIS — J209 Acute bronchitis, unspecified: Secondary | ICD-10-CM

## 2015-02-15 DIAGNOSIS — R05 Cough: Secondary | ICD-10-CM | POA: Diagnosis not present

## 2015-02-15 MED ORDER — ALBUTEROL SULFATE HFA 108 (90 BASE) MCG/ACT IN AERS: 2.0000 | INHALATION_SPRAY | Freq: Four times a day (QID) | RESPIRATORY_TRACT | Status: DC | PRN

## 2015-02-15 MED ORDER — HYDROCODONE-HOMATROPINE 5-1.5 MG/5ML PO SYRP: 5.0000 mL | ORAL_SOLUTION | Freq: Every evening | ORAL | Status: DC | PRN

## 2015-02-15 MED ORDER — DOXYCYCLINE HYCLATE 100 MG PO TABS: 100.0000 mg | ORAL_TABLET | Freq: Two times a day (BID) | ORAL | Status: DC

## 2015-02-15 NOTE — Progress Notes (Signed)
Pre visit review using our clinic review tool, if applicable. No additional management support is needed unless otherwise documented below in the visit note. 

## 2015-02-15 NOTE — Telephone Encounter (Signed)
Please notify Dawn Zavala that her chest xray shows bronchitis and not pneumonia. I have sent Doxycycline antibiotics to her pharmacy. She will take one tablet by mouth twice daily for 10 days until gone. Take the cough syrup at bedtime to help with rest. Please have her call me if no improvement in 3-4 days.  Thank you!

## 2015-02-15 NOTE — Progress Notes (Signed)
Subjective:    Patient ID: Dawn Zavala, female    DOB: 01/15/1971, 44 y.o.   MRN: [id number]  HPI  Dawn Zavala is a 44 year old female who presents today with a chief complaint of cough with ear pain since March 25th. She was evaluated at an urgent care in [facility] 6 days ago and was determined to have bronchitis. She was provided with prednisone  (for 5 days), a z-pack, and tessalon pearls. She completed her Z-Pak and Prednisone but continues to report cough and right ear pain. She's been taken Mucinex and ibuprofen which has helped with her chest congestion, but has now developed some shortness of breath and generalized fatigue. She belives her symptoms have worsened over the past week.  Review of Systems  Constitutional: Negative for fever and chills.  HENT: Positive for ear pain and rhinorrhea. Negative for sinus pressure and sore throat.   Respiratory: Positive for cough and shortness of breath.   Cardiovascular: Negative for chest pain.  Gastrointestinal: Negative for nausea and vomiting.  Musculoskeletal: Positive for myalgias.  Neurological: Positive for headaches.       Past Medical History  Diagnosis Date  . Anxiety   . Pyloric stenosis 1972    repair    History   Social History  . Marital Status: Single    Spouse Name: N/A  . Number of Children: 4  . 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 Smoking status: Current Every Day Smoker  . Smokeless tobacco: Never Used  . Alcohol Use: No  . Drug Use: No  . Sexual Activity: Not on file   Other Topics Concern  . Not on file   Social History Narrative    Past Surgical History  Procedure Laterality Date  . Cesarean section    . Vaginal delivery      x3    Family History  Problem Relation Age of Onset  . Heart disease Neg Hx   . Hypertension Neg Hx     No Known Allergies  Current Outpatient Prescriptions on File  Prior to Visit  Medication Sig Dispense Refill  . ibuprofen (ADVIL,MOTRIN) 200 MG tablet Take 200 mg by mouth.     No current facility-administered medications on file prior to visit.    BP 118/78 mmHg  Pulse 63  Temp(Src) 98.2 F (36.8 C) (Oral)  Ht  (1.626 m)  Wt 274 lb 1.9 oz (124.34 kg)  BMI 47.03 kg/m2  SpO2 98%    Objective:   Physical Exam  Constitutional: She is oriented to person, place, and time.  Appears sickly.  HENT:  Right Ear: There is tenderness. Tympanic membrane is erythematous.  Left Ear: Tympanic membrane is erythematous.  Nose: Nose normal.  Mouth/Throat: Oropharynx is clear and moist. No oropharyngeal exudate.  Eyes: Conjunctivae are normal. Pupils are equal, round, and reactive to light.  Neck: Neck supple.  Cardiovascular: Normal rate and regular rhythm.   Pulmonary/Chest: She has decreased breath sounds in the right lower field. She has rhonchi in the right upper field.  Lymphadenopathy:    She has cervical adenopathy.  Neurological: She is alert and oriented to person, place, and time.  Skin: Skin is warm and dry.          Assessment & Plan:  Acute bronchitis:  Xray reports bronchitis without evidence of consolidation ruling out pneumonia. RX  for Doxycycline BID for 10 days. Hycodan for cough at bedtime. Albuterol inhaler PRN for shortness of breath. Follow up if no improvement in 3-4 days.

## 2015-02-15 NOTE — Patient Instructions (Signed)
Obtain xray prior to leaving today, I will call you with results. Try taking Delsym during the day for cough and take Hycodan at bedtime for cough, this will help you sleep. Use the albuterol inhaler every 6 hours as needed for shortness of breath. I hope you feel better soon!

## 2015-02-15 NOTE — Telephone Encounter (Signed)
Called and notified patient of Kate's comments. Patient verbalized understanding.  

## 2016-07-24 IMAGING — CR DG CHEST 2V
2 series · 2 of 2 positions shown · non-contrast
Comparison: None.

CLINICAL DATA: Productive cough for 2 weeks. Dyspnea on exertion.
Current smoker. COPD.

EXAM:
CHEST  2 VIEW

[view not recorded (1 of 2)]
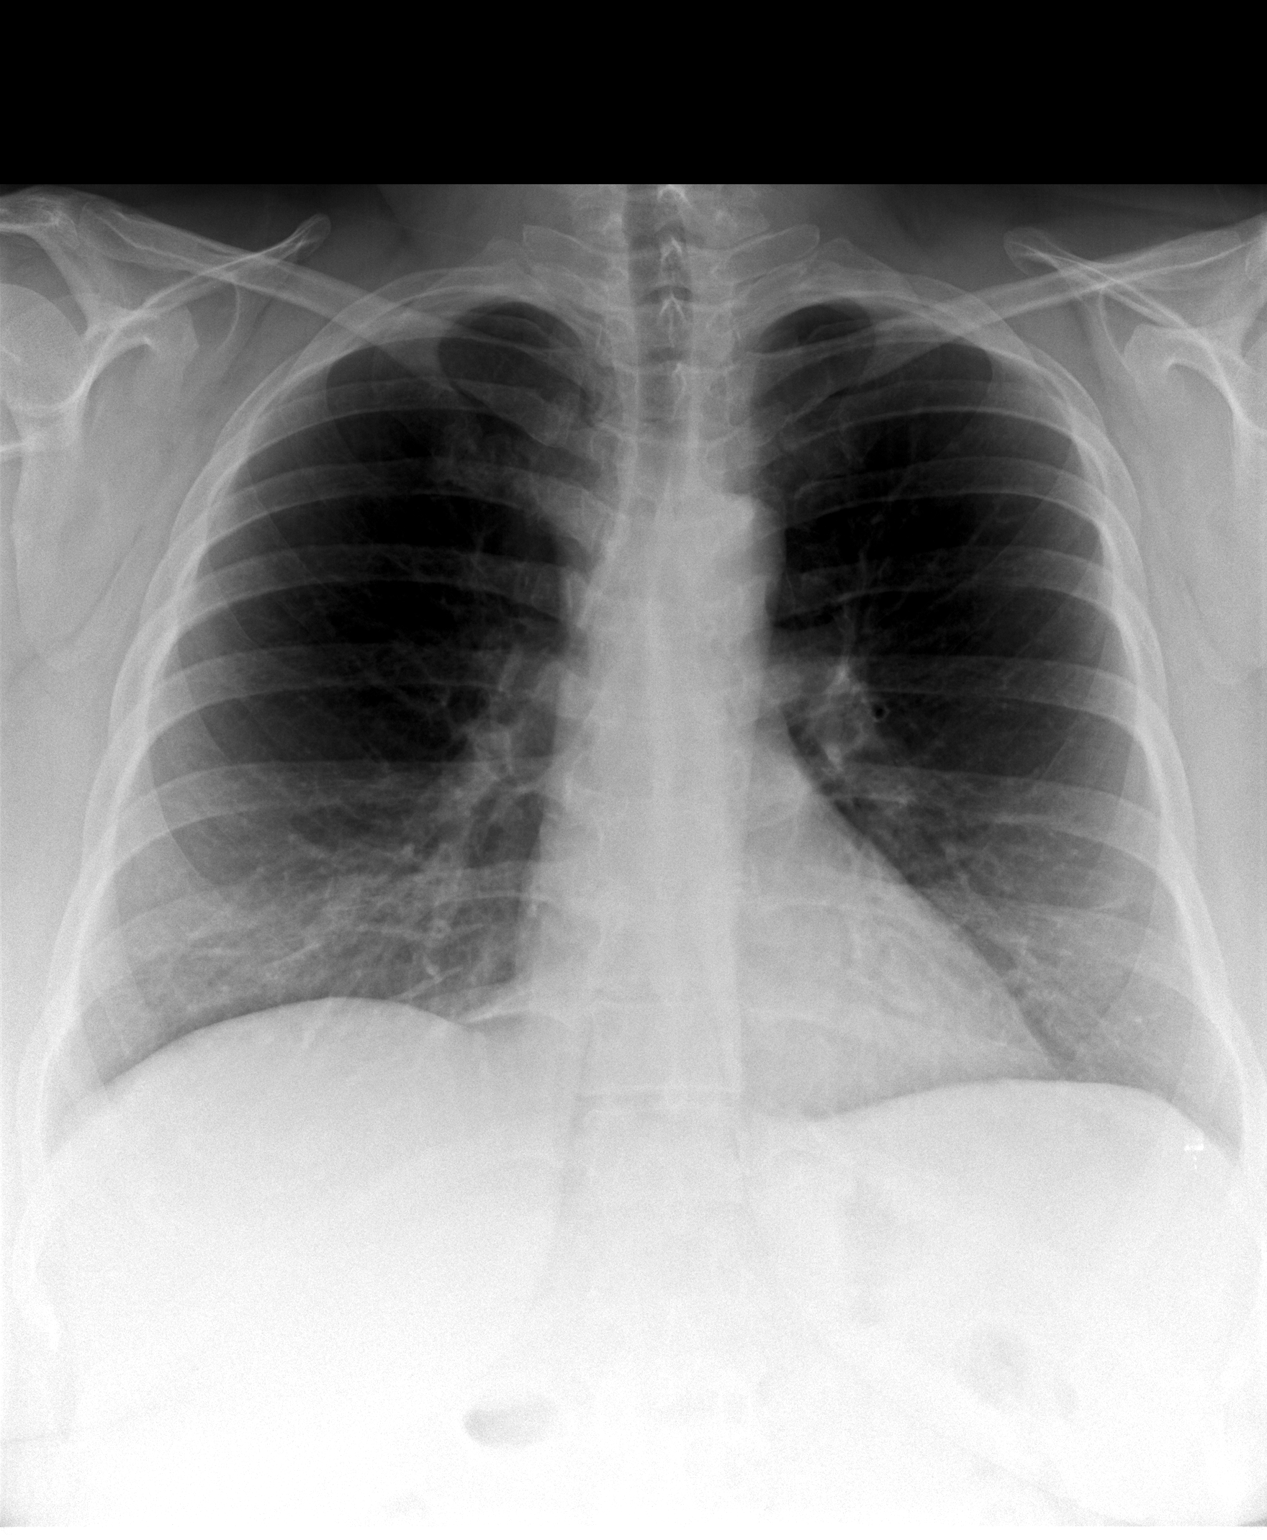

[view not recorded (2 of 2)]
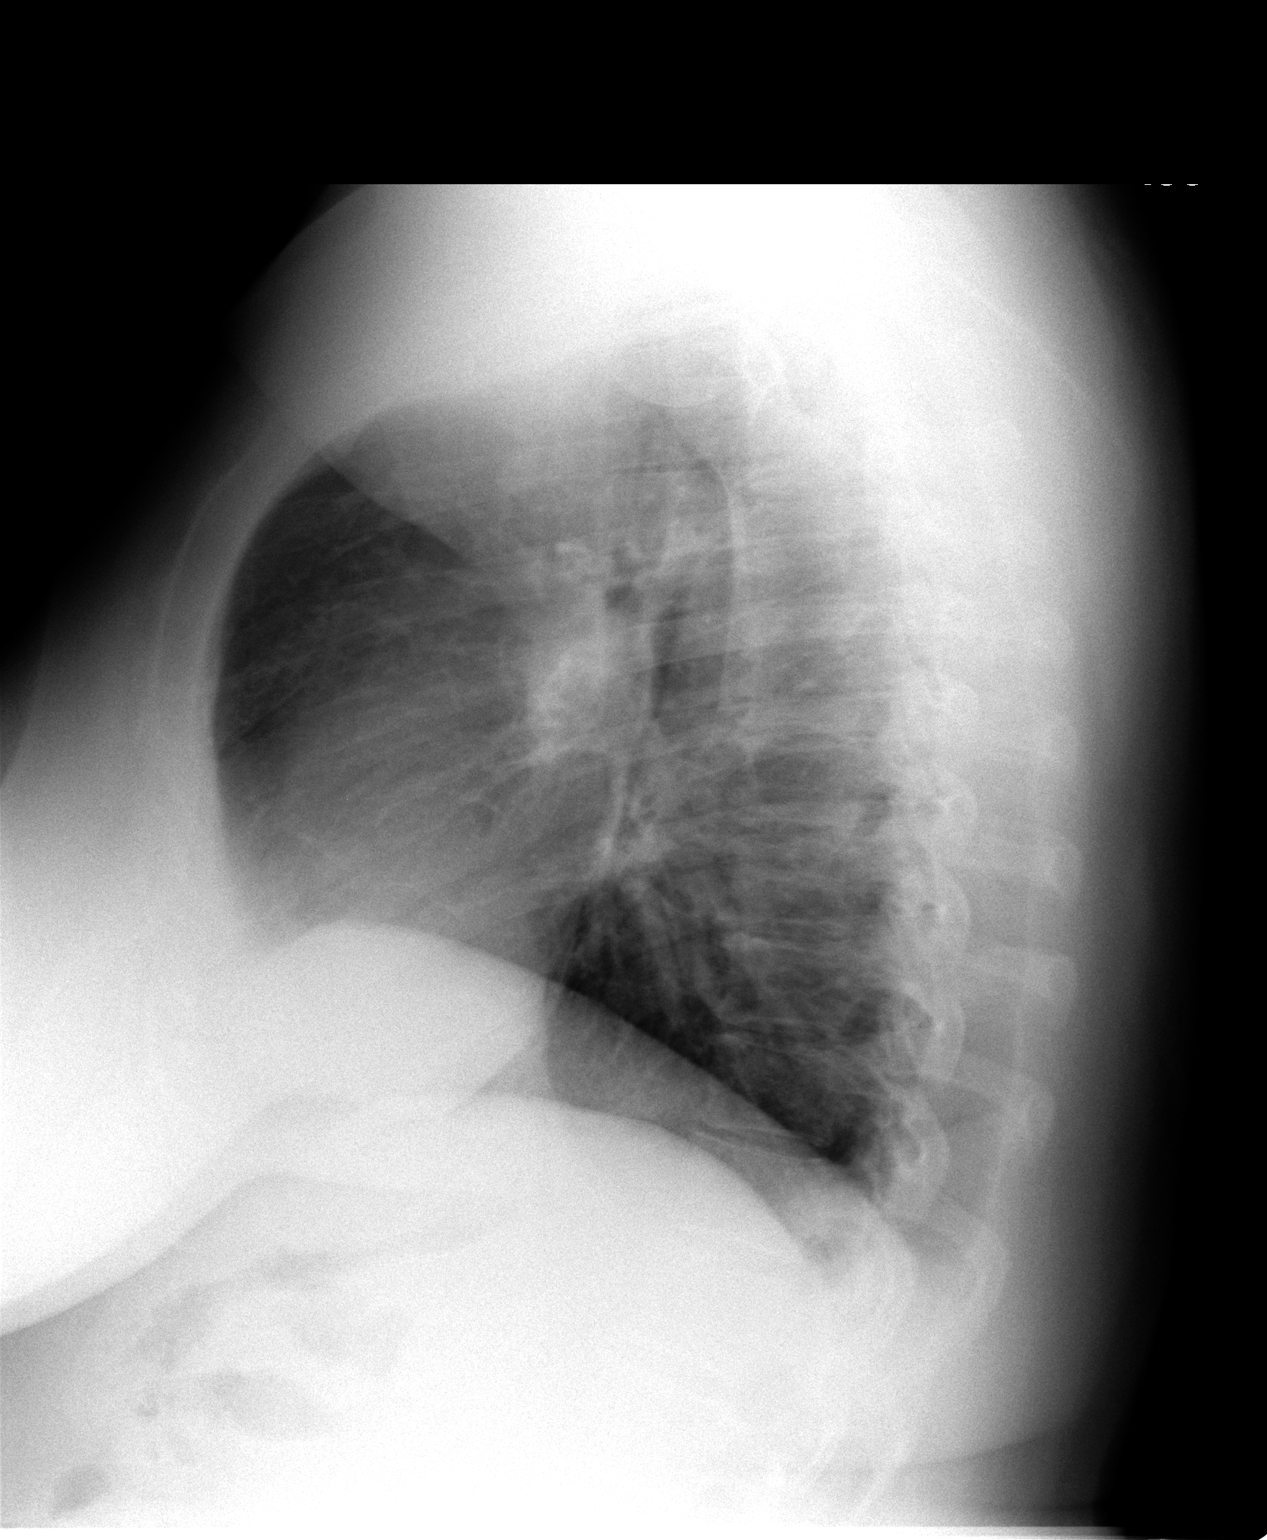

[2 of 2 positions shown; findings below may reference images not displayed]

FINDINGS: Airway thickening is present, suggesting bronchitis or reactive
airways disease. Right infrahilar density thought to represent a
confluence of vascular and osseous shadows.

No pleural effusion.  Cardiac and mediastinal margins appear normal.
IMPRESSION: 1. Airway thickening is present, suggesting bronchitis or reactive
airways disease.

## 2018-04-01 ENCOUNTER — Ambulatory Visit: Payer: BLUE CROSS/BLUE SHIELD | Admitting: Internal Medicine

## 2018-04-01 ENCOUNTER — Telehealth: Payer: Self-pay | Admitting: Internal Medicine

## 2018-04-01 ENCOUNTER — Encounter: Payer: Self-pay | Admitting: Internal Medicine

## 2018-04-01 ENCOUNTER — Other Ambulatory Visit: Payer: Self-pay | Admitting: Internal Medicine

## 2018-04-01 VITALS — BP 104/70 | HR 79 | Temp 98.4°F | Ht 64.0 in | Wt 280.0 lb

## 2018-04-01 DIAGNOSIS — R7309 Other abnormal glucose: Secondary | ICD-10-CM

## 2018-04-01 DIAGNOSIS — R590 Localized enlarged lymph nodes: Secondary | ICD-10-CM | POA: Diagnosis not present

## 2018-04-01 LAB — COMPREHENSIVE METABOLIC PANEL
ALT: 88 U/L — AB (ref 0–35)
AST: 59 U/L — ABNORMAL HIGH (ref 0–37)
Albumin: 3.7 g/dL (ref 3.5–5.2)
Alkaline Phosphatase: 96 U/L (ref 39–117)
BUN: 10 mg/dL (ref 6–23)
CALCIUM: 9.4 mg/dL (ref 8.4–10.5)
CO2: 26 meq/L (ref 19–32)
CREATININE: 0.69 mg/dL (ref 0.40–1.20)
Chloride: 101 mEq/L (ref 96–112)
GFR: 96.97 mL/min (ref >60.00)
Glucose, Bld: 307 mg/dL — ABNORMAL HIGH (ref 70–99)
Potassium: 3.7 mEq/L (ref 3.5–5.1)
Sodium: 134 mEq/L — ABNORMAL LOW (ref 135–145)
Total Bilirubin: 0.7 mg/dL (ref 0.2–1.2)
Total Protein: 6.7 g/dL (ref 6.0–8.3)

## 2018-04-01 LAB — CBC WITH DIFFERENTIAL/PLATELET
BASOS ABS: 0 10*3/uL (ref 0.0–0.1)
Basophils Relative: 0.4 % (ref 0.0–3.0)
Eosinophils Absolute: 0.2 10*3/uL (ref 0.0–0.7)
Eosinophils Relative: 2 % (ref 0.0–5.0)
HCT: 43.1 % (ref 36.0–46.0)
HEMOGLOBIN: 15.2 g/dL — AB (ref 12.0–15.0)
LYMPHS PCT: 31.5 % (ref 12.0–46.0)
Lymphs Abs: 2.5 10*3/uL (ref 0.7–4.0)
MCHC: 35.1 g/dL (ref 30.0–36.0)
MCV: 90.3 fl (ref 78.0–100.0)
Monocytes Absolute: 0.4 10*3/uL (ref 0.1–1.0)
Monocytes Relative: 5.6 % (ref 3.0–12.0)
NEUTROS ABS: 4.8 10*3/uL (ref 1.4–7.7)
Neutrophils Relative %: 60.5 % (ref 43.0–77.0)
PLATELETS: 245 10*3/uL (ref 150.0–400.0)
RBC: 4.78 Mil/uL (ref 3.87–5.11)
RDW: 12.7 % (ref 11.5–15.5)
WBC: 7.9 10*3/uL (ref 4.0–10.5)

## 2018-04-01 LAB — SEDIMENTATION RATE: Sed Rate: 44 mm/hr — ABNORMAL HIGH (ref 0–20)

## 2018-04-01 MED ORDER — CEPHALEXIN 500 MG PO CAPS: 500.0000 mg | ORAL_CAPSULE | Freq: Three times a day (TID) | ORAL | 1 refills | Status: DC

## 2018-04-01 NOTE — Telephone Encounter (Signed)
Spoke to pt about her labs and scheduled her an appt

## 2018-04-01 NOTE — Progress Notes (Signed)
Subjective:    Patient ID: Dawn Zavala, female    DOB: [birth year], 47 y.o.   MRN: [id number]  HPI Here due to neck swelling  Awoke a week ago with "a crick in my neck" Doesn't remember any injury or changes It was stiff It was worsened---swelling on right side and posteriorly Now more pain and tightness by medial clavicle  Has tried ibuprofen  daily--eases it some Using ice---helps some  Current Outpatient Medications on File Prior to Visit  Medication Sig Dispense Refill  . ibuprofen (ADVIL,MOTRIN) 200 MG tablet Take 200 mg by mouth.     No current facility-administered medications on file prior to visit.     No Known Allergies  Past Medical History:  Diagnosis Date  . Anxiety   . Pyloric stenosis 1972   repair    Past Surgical History:  Procedure Laterality Date  . CESAREAN SECTION    . VAGINAL DELIVERY     x3    Family History  Problem Relation Age of Onset  . Heart disease Neg Hx   . Hypertension Neg Hx     Social History   Socioeconomic History  . Marital status: Divorced    Spouse name: Not on file  . Number of children: 4  . Years of education: Not on file  . Highest education level: Not on file  Occupational History  . 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 Financial resource strain: Not on file  . Food insecurity:    Worry: Not on file    Inability: Not on file  . Transportation needs:    Medical: Not on file    Non-medical: Not on file  Tobacco Use  . Smoking status: Current Every Day Smoker  . Smokeless tobacco: Never Used  Substance and Sexual Activity  . Alcohol use: No    Alcohol/week: 0.0 oz  . Drug use: No  . Sexual activity: Not on file  Lifestyle  . Physical activity:    Days per week: Not on file    Minutes per session: Not on file  . Stress: Not on file  Relationships  . Social connections:    Talks on phone: Not on file    Gets together: Not on file    Attends religious  service: Not on file    Active member of club or organization: Not on file    Attends meetings of clubs or organizations: Not on file    Relationship status: Not on file  . Intimate partner violence:    Fear of current or ex partner: Not on file    Emotionally abused: Not on file    Physically abused: Not on file    Forced sexual activity: Not on file  Other Topics Concern  . Not on file  Social History Narrative  . Not on file   Review of Systems No fevers Recent dental visit--no problems No ear pain Some boils in axillae    Objective:   Physical Exam  Constitutional: She appears well-developed. No distress.  HENT:  Mouth/Throat: Oropharynx is clear and moist. No oropharyngeal exudate.  TMs normal  Neck: Normal range of motion. No thyromegaly present.  Enlarged shotty and mildly tender nodes along entire anterior cervical and submental area on right  Cardiovascular: Normal rate, regular rhythm and normal heart sounds. Exam reveals no gallop.  No murmur heard. Respiratory: Effort normal and breath sounds normal. No respiratory distress. She has no  wheezes. She has no rales.  GI: Soft. She exhibits no mass. There is no tenderness.  No HSM  Musculoskeletal: She exhibits no edema.  Lymphadenopathy:       Head (left side): No submental, no submandibular, no tonsillar, no preauricular, no posterior auricular and no occipital adenopathy present.    She has cervical adenopathy.    She has no axillary adenopathy.       Right: No inguinal adenopathy present.       Left: No inguinal adenopathy present.  Does have some non tender cysts in right axilla  Skin: No rash noted.  Psychiatric: She has a normal mood and affect. Her behavior is normal.           Assessment & Plan:

## 2018-04-01 NOTE — Telephone Encounter (Signed)
Copied from CRM [id number]. Topic: Inquiry >> Apr 01, 2018  2:02 PM Maia Petties wrote: Reason for CRM: pt returning Dr. Karle Starch call and states she was advised to ask for [facility]. Per Windell Moment is at lunch. Please call back.

## 2018-04-01 NOTE — Assessment & Plan Note (Signed)
Likely reactive and may be infectious (from axilla?) ROM is good in neck and it doesn't seem muscular Will try empiric antibiotics Check labs Supportive care with ibuprofen/ice

## 2018-04-07 ENCOUNTER — Other Ambulatory Visit (INDEPENDENT_AMBULATORY_CARE_PROVIDER_SITE_OTHER): Payer: BLUE CROSS/BLUE SHIELD

## 2018-04-07 ENCOUNTER — Other Ambulatory Visit: Payer: Self-pay | Admitting: Internal Medicine

## 2018-04-07 DIAGNOSIS — R7309 Other abnormal glucose: Secondary | ICD-10-CM | POA: Diagnosis not present

## 2018-04-07 LAB — GLUCOSE, RANDOM: Glucose, Bld: 202 mg/dL — ABNORMAL HIGH (ref 70–99)

## 2018-04-07 LAB — HEMOGLOBIN A1C: Hgb A1c MFr Bld: 8.8 % — ABNORMAL HIGH (ref 4.6–6.5)

## 2018-04-07 MED ORDER — METFORMIN HCL 1000 MG PO TABS: 1000.0000 mg | ORAL_TABLET | Freq: Two times a day (BID) | ORAL | 11 refills | Status: DC

## 2018-04-13 ENCOUNTER — Ambulatory Visit: Payer: BLUE CROSS/BLUE SHIELD | Admitting: Internal Medicine

## 2018-04-13 ENCOUNTER — Encounter: Payer: Self-pay | Admitting: Internal Medicine

## 2018-04-13 VITALS — BP 102/78 | HR 80 | Temp 98.2°F | Ht 64.0 in | Wt 277.0 lb

## 2018-04-13 DIAGNOSIS — E119 Type 2 diabetes mellitus without complications: Secondary | ICD-10-CM | POA: Diagnosis not present

## 2018-04-13 NOTE — Assessment & Plan Note (Addendum)
She does understand the diagnosis (she is a Engineer, civil (consulting)nurse) Trying to cut down on carbs Needs to do more exercise Will set up with diabetes education  Discussed need to stop smoking Discussed statin---will discuss again next time Will change metformin to ER if ongoing problems  Counseled all of ~15 minute visit

## 2018-04-13 NOTE — Progress Notes (Signed)
   Subjective:    Patient ID: Dawn Zavala, female    DOB: 1971-07-26, 47 y.o.   MRN: [id number]  HPI Here for follow up after diagnosis of diabetes  Did start the metformin--hit her within 3 hours Cramps and diarrhea--some nausea Has been taking it bid since then---symptoms are easing off (taking the metformin on a full stomach)  Is very heavy on carbs---trying to cut back Feels she needs help No machine to check sugars  Current Outpatient Medications on File Prior to Visit  Medication Sig Dispense Refill  . ibuprofen (ADVIL,MOTRIN) 200 MG tablet Take 200 mg by mouth.    . metFORMIN (GLUCOPHAGE) 1000 MG tablet Take 1 tablet (1,000 mg total) by mouth 2 (two) times daily with a meal. 60 tablet 11   No current facility-administered medications on file prior to visit.     No Known Allergies  Past Medical History:  Diagnosis Date  . Anxiety   . Pyloric stenosis 1972   repair    Past Surgical History:  Procedure Laterality Date  . CESAREAN SECTION    . VAGINAL DELIVERY     x3    Family History  Problem Relation Age of Onset  . Heart disease Neg Hx   . Hypertension Neg Hx     Social History   Socioeconomic History  . Marital status: Divorced    Spouse name: Not on file  . Number of children: 4  . Years of education: Not on file  . Highest education level: Not on file  Occupational History  . Occupation: LPN    [place] [employer] [place]  Social Needs  . Financial resource strain: Not on file  . Food insecurity:    Worry: Not on file    Inability: Not on file  . Transportation needs:    Medical: Not on file    Non-medical: Not on file  Tobacco Use  . Smoking status: Current Every Day Smoker  . Smokeless tobacco: Never Used  Substance and Sexual Activity  . Alcohol use: No    Alcohol/week: 0.0 oz  . Drug use: No  . Sexual activity: Not on file  Lifestyle  . Physical activity:    Days per week: Not on file    Minutes per session: Not on file   . Stress: Not on file  Relationships  . Social connections:    Talks on phone: Not on file    Gets together: Not on file    Attends religious service: Not on file    Active member of club or organization: Not on file    Attends meetings of clubs or organizations: Not on file    Relationship status: Not on file  . Intimate partner violence:    Fear of current or ex partner: Not on file    Emotionally abused: Not on file    Physically abused: Not on file    Forced sexual activity: Not on file  Other Topics Concern  . Not on file  Social History Narrative  . Not on file   Review of Systems Has started walking Now drinking water--- cutting down on Diet Pepsi    Objective:   Physical Exam  Constitutional: No distress.  Psychiatric: She has a normal mood and affect. Her behavior is normal.           Assessment & Plan:

## 2018-05-04 ENCOUNTER — Encounter: Payer: BLUE CROSS/BLUE SHIELD | Attending: Internal Medicine | Admitting: Dietician

## 2018-05-04 ENCOUNTER — Encounter: Payer: Self-pay | Admitting: Dietician

## 2018-05-04 ENCOUNTER — Encounter: Payer: Self-pay | Admitting: Internal Medicine

## 2018-05-04 VITALS — BP 110/80 | Ht 65.0 in | Wt 273.5 lb

## 2018-05-04 DIAGNOSIS — Z713 Dietary counseling and surveillance: Secondary | ICD-10-CM | POA: Diagnosis not present

## 2018-05-04 DIAGNOSIS — Z6841 Body Mass Index (BMI) 40.0 and over, adult: Secondary | ICD-10-CM | POA: Insufficient documentation

## 2018-05-04 DIAGNOSIS — E119 Type 2 diabetes mellitus without complications: Secondary | ICD-10-CM

## 2018-05-04 NOTE — Progress Notes (Signed)
Diabetes Self-Management Education  Visit Type: First/Initial  Appt. Start Time: 1340 Appt. End Time: 1440  05/04/2018  Ms. Meliton RattanElizabeth Weis, identified by name and date of birth, is a 47 y.o. female with a diagnosis of Diabetes: Type 2.   ASSESSMENT  Blood pressure 110/80, height 5\' 5"  (1.651 m), weight 273 lb 8 oz (124.1 kg). Body mass index is 45.51 kg/m.  Diabetes Self-Management Education - 05/04/18 1456      Visit Information   Visit Type  First/Initial      Initial Visit   Diabetes Type  Type 2      Health Coping   How would you rate your overall health?  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  Needs Review    Patient understands incorporating nutritional management into lifestyle.  Needs Review    Patient undertands incorporating physical activity into lifestyle.  Needs Review    Patient understands using medications safely.  Needs Review    Patient understands monitoring blood glucose, interpreting and using results  Needs Instruction    Patient understands prevention, detection, and treatment of acute complications.  Needs Review    Patient understands prevention, detection, and treatment of chronic complications.  Needs Review    Patient understands how to develop strategies to address psychosocial issues.  Needs Review    Patient understands how to develop strategies to promote health/change behavior.  Needs Review      Complications   Last HgB A1C per patient/outside source  8.8  % 04-07-18    How often do you check your blood sugar?  0 times/day (not testing)    Have you had a dilated eye exam in the past 12 months?  No 06-2016    Have you had a dental exam in the past 12 months?  Yes 03-2018    Are you checking your feet?  No      Dietary Intake   Breakfast  eats breakfast 7a-9a=recently eats boiled egg, bacon and oatmeal    Snack (morning)  eats fruit, cheese, nuts, yogurt or granola bar at 10a    Lunch  eats lunch at 11:30a-1p (meal times vary due to work)-eats fried foods 2-3x/wk and snack foods/weets 4-5x/wk    Snack (afternoon)  eats fruit, cheese, nuts, yofurt or granola bar at 3p    Dinner  eats supper at 5p-7p    Snack (evening)  none    Beverage(s)  drinks water 8+x/day and diet sodas 2-3x/day      Exercise   Exercise Type  ADL's      Patient Education   Previous Diabetes Education  Yes (please comment) in nursing school    Disease state   Definition of diabetes, type 1 and 2, and the diagnosis of diabetes    Nutrition management   Role of diet in the treatment of diabetes and the relationship between the three main macronutrients and blood glucose level;Carbohydrate counting;Food  label reading, portion sizes and measuring food.    Physical activity and exercise   Role of exercise on diabetes management, blood pressure control and cardiac health.;Helped patient identify appropriate exercises in relation to his/her diabetes, diabetes complications and other health issue.    Medications  Reviewed patients medication for diabetes, action, purpose, timing of dose and side effects.    Monitoring  Taught/evaluated SMBG meter.;Taught/discussed recording of test results and interpretation of SMBG.;Identified appropriate SMBG and/or A1C goals.;Yearly dilated eye exam;Purpose and frequency of SMBG. gave pt Contour Next meter and instructed on its use-BG 115 ac lunch    Chronic complications  Relationship between chronic complications and blood glucose  control;Retinopathy and reason for yearly dilated eye exams;Dental care;Nephropathy, what it is, prevention of, the use of ACE, ARB's and early detection of through urine microalbumia.;Reviewed with patient heart disease, higher risk of, and prevention    Personal strategies to promote health  Lifestyle issues that need to be addressed for better diabetes care;Helped patient develop diabetes management plan for (enter comment);Review risk of smoking and offered smoking cessation      Outcomes   Expected Outcomes  Demonstrated interest in learning. Expect positive outcomes       Individualized Plan for Diabetes Self-Management Training:   Learning Objective:  Patient will have a greater understanding of diabetes self-management. Patient education plan is to attend individual and/or group sessions per assessed needs and concerns.   Plan:   Check blood sugars 2 x day before breakfast and 2 hrs after supper every day and record Bring blood sugar records to the next appointment/class Call your doctor for a prescription for:  1. Meter strips (type)  Contour Next test strips  checking  2 times per day  2. Lancets (type) BD Ultra Fine II lancets  Checking 2    times per day Exercise:  Start cardio/wts at gym 30 min 2-3x/wk Eat 3 meals day and 1 snack a day at bedtime Space meals 4-6 hours apart Eat 2-3 carbohydrate servings/meal + protein Eat 1 carbohydrate serving/snack + protein Avoid sugar sweetened drinks (soda, tea, coffee, sports drinks, juices) Limit intake of sweets, fried foods and snack foods Quit  Smoking-consider smoking cessation classes Get a Transport planner Return for appointment/classes on:  05-18-18    Expected Outcomes:  Demonstrated interest in learning. Expect positive outcomes  Education material provided: General meal planning guidelines, Contour Next meter, handout on smoking cessation classes  If problems or questions, patient to contact team via:   [phone]  Future DSME appointment:   05-18-18

## 2018-05-04 NOTE — Patient Instructions (Addendum)
  Check blood sugars 2 x day before breakfast and 2 hrs after supper every day and record Bring blood sugar records to the next appointment/class Call your doctor for a prescription for:  1. Meter strips (type)  Contour Next test strips  checking  2 times per day  2. Lancets (type) BD Ultra Fine II lancets  Checking 2    times per day Exercise:  Start cardio/wts at gym 30 min 2-3x/wk Eat 3 meals day and 1 snack a day at bedtime Space meals 4-6 hours apart Eat 2-3 carbohydrate servings/meal + protein Eat 1 carbohydrate serving/snack + protein Avoid sugar sweetened drinks (soda, tea, coffee, sports drinks, juices) Limit intake of sweets and fried foods and snack foods Quit  Smoking-consider smoking cessation classes Get a Transport plannerharps container Make eye doctor appointment Return for appointment/classes on:  05-18-18

## 2018-05-05 MED ORDER — BD ULTRA-FINE LANCETS MISC: 3 refills | Status: AC

## 2018-05-05 MED ORDER — GLUCOSE BLOOD VI STRP: ORAL_STRIP | 3 refills | Status: DC

## 2018-05-18 ENCOUNTER — Encounter: Payer: BLUE CROSS/BLUE SHIELD | Attending: Internal Medicine | Admitting: Dietician

## 2018-05-18 ENCOUNTER — Encounter: Payer: Self-pay | Admitting: Dietician

## 2018-05-18 VITALS — Ht 65.0 in | Wt 275.0 lb

## 2018-05-18 DIAGNOSIS — E119 Type 2 diabetes mellitus without complications: Secondary | ICD-10-CM | POA: Diagnosis not present

## 2018-05-18 DIAGNOSIS — Z713 Dietary counseling and surveillance: Secondary | ICD-10-CM | POA: Diagnosis not present

## 2018-05-18 DIAGNOSIS — Z6841 Body Mass Index (BMI) 40.0 and over, adult: Secondary | ICD-10-CM | POA: Insufficient documentation

## 2018-05-18 NOTE — Progress Notes (Signed)

## 2018-05-25 ENCOUNTER — Encounter: Payer: Self-pay | Admitting: Dietician

## 2018-05-25 ENCOUNTER — Encounter: Payer: BLUE CROSS/BLUE SHIELD | Admitting: Dietician

## 2018-05-25 VITALS — Wt 272.4 lb

## 2018-05-25 DIAGNOSIS — Z713 Dietary counseling and surveillance: Secondary | ICD-10-CM | POA: Diagnosis not present

## 2018-05-25 DIAGNOSIS — E119 Type 2 diabetes mellitus without complications: Secondary | ICD-10-CM

## 2018-05-25 NOTE — Progress Notes (Signed)

## 2018-06-04 ENCOUNTER — Encounter: Payer: Self-pay | Admitting: Dietician

## 2018-06-04 ENCOUNTER — Encounter: Payer: BLUE CROSS/BLUE SHIELD | Admitting: Dietician

## 2018-06-04 VITALS — BP 102/60 | Ht 65.0 in | Wt 268.1 lb

## 2018-06-04 DIAGNOSIS — E119 Type 2 diabetes mellitus without complications: Secondary | ICD-10-CM

## 2018-06-04 DIAGNOSIS — Z713 Dietary counseling and surveillance: Secondary | ICD-10-CM | POA: Diagnosis not present

## 2018-06-04 NOTE — Progress Notes (Signed)

## 2018-06-08 ENCOUNTER — Encounter: Payer: Self-pay | Admitting: Dietician

## 2018-07-17 ENCOUNTER — Ambulatory Visit: Payer: BLUE CROSS/BLUE SHIELD | Admitting: Internal Medicine

## 2018-07-17 ENCOUNTER — Encounter: Payer: Self-pay | Admitting: Internal Medicine

## 2018-07-17 VITALS — BP 110/82 | HR 73 | Temp 98.0°F | Ht 65.0 in | Wt 263.0 lb

## 2018-07-17 DIAGNOSIS — Z5320 Procedure and treatment not carried out because of patient's decision for unspecified reasons: Secondary | ICD-10-CM | POA: Insufficient documentation

## 2018-07-17 DIAGNOSIS — E119 Type 2 diabetes mellitus without complications: Secondary | ICD-10-CM

## 2018-07-17 LAB — POCT GLYCOSYLATED HEMOGLOBIN (HGB A1C): HEMOGLOBIN A1C: 6.1 % — AB (ref 4.0–5.6)

## 2018-07-17 LAB — HM DIABETES FOOT EXAM

## 2018-07-17 NOTE — Assessment & Plan Note (Signed)
Lab Results  Component Value Date   HGBA1C 6.1 (A) 07/17/2018   Excellent control Discussed ongoing lifestyle Discussed liraglutide--she will look into it She does not want a statin

## 2018-07-17 NOTE — Assessment & Plan Note (Signed)
Will check levels next time and continue discussions

## 2018-07-17 NOTE — Progress Notes (Signed)
Subjective:    Patient ID: Dawn Zavala, female    DOB: 1971-10-23, 47 y.o.   MRN: [id number]  HPI Here for follow up of diabetes Is adjusting to the metformin---will hold at times if she is travelling Went to all the nutrition sessions  Not really checking sugars lately Did do it for a month  Going to get an eye exam---walking in at [facility] No foot numbness, sores, pain  Current Outpatient Medications on File Prior to Visit  Medication Sig Dispense Refill  . BD ULTRA-FINE LANCETS lancets Use to obtain blood sugar sample once a day 100 each 3  . glucose blood (CONTOUR NEXT TEST) test strip Use to check blood sugar once a day 100 each 3  . ibuprofen (ADVIL,MOTRIN) 200 MG tablet Take 400 mg by mouth as needed.     . metFORMIN (GLUCOPHAGE) 1000 MG tablet Take 1 tablet (1,000 mg total) by mouth 2 (two) times daily with a meal. 60 tablet 11   No current facility-administered medications on file prior to visit.     Allergies  Allergen Reactions  . Hydrocodone-Acetaminophen Hives  . Ondansetron Itching    Past Medical History:  Diagnosis Date  . Anxiety   . Obesity   . Pyloric stenosis 1972   repair    Past Surgical History:  Procedure Laterality Date  . CESAREAN SECTION    . VAGINAL DELIVERY     x3    Family History  Problem Relation Age of Onset  . Heart disease Neg Hx   . Hypertension Neg Hx     Social History   Socioeconomic History  . Marital status: Divorced    Spouse name: Not on file  . Number of children: 4  . Years of education: Not on file  . Highest education level: Not on file  Occupational History  . 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 Financial resource strain: Not on file  . Food insecurity:    Worry: Not on file    Inability: Not on file  . Transportation needs:    Medical: Not on file    Non-medical: Not on file  Tobacco Use  . Smoking status: Current Every Day Smoker    Packs/day: 1.50   Types: Cigarettes  . Smokeless tobacco: Never Used  Substance and Sexual Activity  . Alcohol use: No    Alcohol/week: 0.0 standard drinks  . Drug use: No  . Sexual activity: Not on file  Lifestyle  . Physical activity:    Days per week: Not on file    Minutes per session: Not on file  . Stress: Not on file  Relationships  . Social connections:    Talks on phone: Not on file    Gets together: Not on file    Attends religious service: Not on file    Active member of club or organization: Not on file    Attends meetings of clubs or organizations: Not on file    Relationship status: Not on file  . Intimate partner violence:    Fear of current or ex partner: Not on file    Emotionally abused: Not on file    Physically abused: Not on file    Forced sexual activity: Not on file  Other Topics Concern  . Not on file  Social History Narrative  . Not on file   Review of Systems  Slow weight loss Sleeps well Still smoking---"working on it"  Objective:   Physical Exam  Constitutional: No distress.  Neck: No thyromegaly present.  Cardiovascular: Normal rate, regular rhythm, normal heart sounds and intact distal pulses. Exam reveals no gallop.  No murmur heard. Respiratory: Effort normal and breath sounds normal. No respiratory distress. She has no wheezes. She has no rales.  Musculoskeletal: She exhibits no edema.  Lymphadenopathy:    She has no cervical adenopathy.  Neurological:  Normal sensation in feet  Skin:  No foot lesions  Psychiatric: She has a normal mood and affect. Her behavior is normal.           Assessment & Plan:

## 2018-07-20 LAB — HM DIABETES EYE EXAM

## 2018-07-22 ENCOUNTER — Encounter: Payer: Self-pay | Admitting: Internal Medicine

## 2018-10-04 ENCOUNTER — Encounter: Payer: Self-pay | Admitting: Gynecology

## 2018-10-04 ENCOUNTER — Other Ambulatory Visit: Payer: Self-pay

## 2018-10-04 ENCOUNTER — Ambulatory Visit
Admission: EM | Admit: 2018-10-04 | Discharge: 2018-10-04 | Disposition: A | Discharge status: Home / Self Care | Payer: BLUE CROSS/BLUE SHIELD | Attending: Family Medicine | Admitting: Family Medicine

## 2018-10-04 DIAGNOSIS — X58XXXA Exposure to other specified factors, initial encounter: Secondary | ICD-10-CM | POA: Diagnosis not present

## 2018-10-04 DIAGNOSIS — S29012A Strain of muscle and tendon of back wall of thorax, initial encounter: Secondary | ICD-10-CM | POA: Diagnosis not present

## 2018-10-04 HISTORY — DX: Type 2 diabetes mellitus without complications: E11.9

## 2018-10-04 MED ORDER — CYCLOBENZAPRINE HCL 5 MG PO TABS: 5.0000 mg | ORAL_TABLET | Freq: Every day | ORAL | 0 refills | Status: DC

## 2018-10-04 MED ORDER — MELOXICAM 15 MG PO TABS: 15.0000 mg | ORAL_TABLET | Freq: Every day | ORAL | 0 refills | Status: DC

## 2018-10-04 NOTE — ED Provider Notes (Signed)
[facility]    CSN: [id number] Arrival date & time: 10/04/18  [id number]     History   Chief Complaint No chief complaint on file.   HPI Dawn Zavala is a 47 y.o. female.   47 yo female with a c/o right upper back, neck and shoulder pain for 6 days. States she woke up one day with the pain. Denies any falls or other traumatic injury.   The history is provided by the patient.    Past Medical History:  Diagnosis Date  . Anxiety   . Diabetes mellitus without complication (HCC)   . Obesity   . Pyloric stenosis 1972   repair    Patient Active Problem List   Diagnosis Date Noted  . Statin declined 07/17/2018  . Controlled type 2 diabetes mellitus without complication, without long-term current use of insulin (HCC) 04/13/2018  . Cervical lymphadenopathy 04/01/2018  . Viral syndrome 08/01/2014  . Allergic reaction 04/02/2013  . ANXIETY 12/20/2009  . TOBACCO ABUSE 11/13/2007    Past Surgical History:  Procedure Laterality Date  . CESAREAN SECTION    . VAGINAL DELIVERY     x3    OB History   None      Home Medications    Prior to Admission medications   Medication Sig Start Date End Date Taking? Authorizing Provider  BD ULTRA-FINE LANCETS lancets Use to obtain blood sugar sample once a day 05/05/18  Yes Tillman AbideLetvak, Richard I, MD  glucose blood (CONTOUR NEXT TEST) test strip Use to check blood sugar once a day 05/05/18  Yes Karie SchwalbeLetvak, Richard I, MD  ibuprofen (ADVIL,MOTRIN) 200 MG tablet Take 400 mg by mouth as needed.    Yes [provider]  metFORMIN (GLUCOPHAGE) 1000 MG tablet Take 1 tablet (1,000 mg total) by mouth 2 (two) times daily with a meal. 04/07/18  Yes Karie SchwalbeLetvak, Richard I, MD  cyclobenzaprine (FLEXERIL) 5 MG tablet Take 1 tablet (5 mg total) by mouth at bedtime. 10/04/18   Payton Mccallumonty, Jaston Havens, MD  meloxicam (MOBIC) 15 MG tablet Take 1 tablet (15 mg total) by mouth daily. 10/04/18   Payton Mccallumonty, Lois Ostrom, MD    Family History Family History    Problem Relation Age of Onset  . Heart disease Neg Hx   . Hypertension Neg Hx     Social History Social History   Tobacco Use  . Smoking status: Current Every Day Smoker    Packs/day: 1.50    Types: Cigarettes  . Smokeless tobacco: Never Used  Substance Use Topics  . Alcohol use: No    Alcohol/week: 0.0 standard drinks  . Drug use: No     Allergies   Hydrocodone-acetaminophen and Ondansetron   Review of Systems Review of Systems   Physical Exam Triage Vital Signs ED Triage Vitals  Enc Vitals Group     BP 10/04/18 0945 133/79     Pulse Rate 10/04/18 0945 78     Resp 10/04/18 0945 18     Temp 10/04/18 0945 98.1 F (36.7 C)     Temp Source 10/04/18 0945 Oral     SpO2 10/04/18 0945 100 %     Weight 10/04/18 0944 260 lb (117.9 kg)     Height 10/04/18 0944 5\' 5"  (1.651 m)     Head Circumference --      Peak Flow --      Pain Score 10/04/18 0944 3     Pain Loc --      Pain Edu? --  Excl. in GC? --    No data found.  Updated Vital Signs BP 133/79 (BP Location: Left Arm)   Pulse 78   Temp 98.1 F (36.7 C) (Oral)   Resp 18   Ht 5\' 5"  (1.651 m)   Wt 117.9 kg   SpO2 100%   BMI 43.27 kg/m   Visual Acuity Right Eye Distance:   Left Eye Distance:   Bilateral Distance:    Right Eye Near:   Left Eye Near:    Bilateral Near:     Physical Exam  Constitutional: She appears well-developed and well-nourished. No distress.  Musculoskeletal:       Right shoulder: Normal.       Cervical back: She exhibits tenderness (over the right trapezius muscle) and spasm (over the right trapezius muscle).  Skin: She is not diaphoretic.  Nursing note and vitals reviewed.    UC Treatments / Results  Labs (all labs ordered are listed, but only abnormal results are displayed) Labs Reviewed - No data to display  EKG None  Radiology No results found.  Procedures Procedures (including critical care time)  Medications Ordered in UC Medications - No data to  display  Initial Impression / Assessment and Plan / UC Course  I have reviewed the triage vital signs and the nursing notes.  Pertinent labs & imaging results that were available during my care of the patient were reviewed by me and considered in my medical decision making (see chart for details).      Final Clinical Impressions(s) / UC Diagnoses   Final diagnoses:  Upper back strain, initial encounter    ED Prescriptions    Medication Sig Dispense Auth. Provider   meloxicam (MOBIC) 15 MG tablet Take 1 tablet (15 mg total) by mouth daily. 30 tablet Payton Mccallum, MD   cyclobenzaprine (FLEXERIL) 5 MG tablet Take 1 tablet (5 mg total) by mouth at bedtime. 30 tablet Payton Mccallum, MD      1. diagnosis reviewed with patient 2. rx as per orders above; reviewed possible side effects, interactions, risks and benefits  3. Recommend supportive treatment with heat to area, stretches 4. Follow-up prn if symptoms worsen or don't improve   Controlled Substance Prescriptions [place] Controlled Substance Registry consulted? Not Applicable   Payton Mccallum, MD 10/04/18 1106

## 2018-10-04 NOTE — ED Triage Notes (Signed)
Per patient right shoulder pain x 6 days ago. Per patient no injury to her shoulder

## 2019-01-22 ENCOUNTER — Encounter: Payer: Self-pay | Admitting: Internal Medicine

## 2019-01-22 ENCOUNTER — Ambulatory Visit: Payer: BLUE CROSS/BLUE SHIELD | Admitting: Internal Medicine

## 2019-01-22 ENCOUNTER — Other Ambulatory Visit: Payer: Self-pay

## 2019-01-22 VITALS — BP 110/80 | HR 83 | Temp 98.1°F | Ht 65.0 in | Wt 269.0 lb

## 2019-01-22 DIAGNOSIS — E119 Type 2 diabetes mellitus without complications: Secondary | ICD-10-CM

## 2019-01-22 DIAGNOSIS — Z23 Encounter for immunization: Secondary | ICD-10-CM

## 2019-01-22 DIAGNOSIS — F39 Unspecified mood [affective] disorder: Secondary | ICD-10-CM

## 2019-01-22 LAB — POCT GLYCOSYLATED HEMOGLOBIN (HGB A1C): Hemoglobin A1C: 7.1 % — AB (ref 4.0–5.6)

## 2019-01-22 MED ORDER — BUPROPION HCL ER (SR) 150 MG PO TB12: 150.0000 mg | ORAL_TABLET | Freq: Two times a day (BID) | ORAL | 11 refills | Status: DC

## 2019-01-22 MED ORDER — METFORMIN HCL ER 500 MG PO TB24: 1000.0000 mg | ORAL_TABLET | Freq: Every day | ORAL | 3 refills | Status: DC

## 2019-01-22 MED ORDER — LIRAGLUTIDE 18 MG/3ML ~~LOC~~ SOPN: 1.2000 mg | PEN_INJECTOR | Freq: Every day | SUBCUTANEOUS | 11 refills | Status: DC

## 2019-01-22 NOTE — Progress Notes (Signed)
Subjective:    Patient ID: Dawn Zavala, female    DOB: 1971/02/21, 48 y.o.   MRN: [id number]  HPI Here for follow up of diabetes  Not doing as well with the diabetes Trouble remembering to take medication Taking about 1/2 of usual doses---forgets afternoon dose mostly Checks sugars occasionally---- "okay" (but can be in high 100's) Has gained back some weight  Has occasional "electrical impulses legs and arms" Not that often No clear triggers  Current Outpatient Medications on File Prior to Visit  Medication Sig Dispense Refill  . BD ULTRA-FINE LANCETS lancets Use to obtain blood sugar sample once a day 100 each 3  . glucose blood (CONTOUR NEXT TEST) test strip Use to check blood sugar once a day 100 each 3  . ibuprofen (ADVIL,MOTRIN) 200 MG tablet Take 400 mg by mouth as needed.     . metFORMIN (GLUCOPHAGE) 1000 MG tablet Take 1 tablet (1,000 mg total) by mouth 2 (two) times daily with a meal. 60 tablet 11   No current facility-administered medications on file prior to visit.     Allergies  Allergen Reactions  . Hydrocodone-Acetaminophen Hives  . Ondansetron Itching    Past Medical History:  Diagnosis Date  . Anxiety   . Diabetes mellitus without complication (HCC)   . Obesity   . Pyloric stenosis 1972   repair    Past Surgical History:  Procedure Laterality Date  . CESAREAN SECTION    . VAGINAL DELIVERY     x3    Family History  Problem Relation Age of Onset  . Heart disease Neg Hx   . Hypertension Neg Hx     Social History   Socioeconomic History  . Marital status: Divorced    Spouse name: Not on file  . Number of children: 4  . Years of education: Not on file  . Highest education level: Not on file  Occupational History  . 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 Financial resource strain: Not on file  . Food insecurity:    Worry: Not on file    Inability: Not on file  . Transportation needs:    Medical:  Not on file    Non-medical: Not on file  Tobacco Use  . Smoking status: Current Every Day Smoker    Packs/day: 1.50    Types: Cigarettes  . Smokeless tobacco: Never Used  Substance and Sexual Activity  . Alcohol use: No    Alcohol/week: 0.0 standard drinks  . Drug use: No  . Sexual activity: Not on file  Lifestyle  . Physical activity:    Days per week: Not on file    Minutes per session: Not on file  . Stress: Not on file  Relationships  . Social connections:    Talks on phone: Not on file    Gets together: Not on file    Attends religious service: Not on file    Active member of club or organization: Not on file    Attends meetings of clubs or organizations: Not on file    Relationship status: Not on file  . Intimate partner violence:    Fear of current or ex partner: Not on file    Emotionally abused: Not on file    Physically abused: Not on file    Forced sexual activity: Not on file  Other Topics Concern  . Not on file  Social History Narrative  . Not on  file   Review of Systems Feels irritable---"I get pissed" Not related to work really --but it is stressful Was walking--but got out of the habit (plans to restart) No chest pain No SOB Occasional bad burning in RUQ if she bends over the wrong way Still not able to stop smoking    Objective:   Physical Exam  Constitutional: She appears well-developed. No distress.  Neck: No thyromegaly present.  Cardiovascular: Normal rate, regular rhythm, normal heart sounds and intact distal pulses. Exam reveals no gallop.  No murmur heard. Respiratory: Effort normal and breath sounds normal. No respiratory distress. She has no wheezes. She has no rales.  Lymphadenopathy:    She has no cervical adenopathy.  Skin:  No foot lesions  Psychiatric: She has a normal mood and affect. Her behavior is normal.           Assessment & Plan:

## 2019-01-22 NOTE — Addendum Note (Signed)
Addended by: Eual Fines on: 01/22/2019 10:13 AM   Modules accepted: Orders

## 2019-01-22 NOTE — Assessment & Plan Note (Signed)
Getting angry a lot now After discussion, will start bupropion---in hopes it will help her stop smoking as well

## 2019-01-22 NOTE — Assessment & Plan Note (Signed)
Control has worsened Will change to ER metformin---just once a day Add liraglutide for control and weight loss Still unwilling to take any statin---discussed   Lab Results  Component Value Date   HGBA1C 7.1 (A) 01/22/2019

## 2019-02-13 ENCOUNTER — Other Ambulatory Visit: Payer: Self-pay | Admitting: Internal Medicine

## 2019-02-22 ENCOUNTER — Telehealth: Payer: Self-pay | Admitting: *Deleted

## 2019-02-22 MED ORDER — PEN NEEDLES 31G X 8 MM MISC: 1.0000 | Freq: Every day | 3 refills | Status: DC

## 2019-02-22 NOTE — Telephone Encounter (Signed)
Patient left a voicemail that she never got needles to use with her Victoza. Patient requested that a new script be sent to [facility] for her.

## 2019-02-22 NOTE — Telephone Encounter (Signed)
Prescription sent for pen needles

## 2019-05-04 ENCOUNTER — Other Ambulatory Visit: Payer: Self-pay | Admitting: Internal Medicine

## 2019-12-17 LAB — HM DIABETES EYE EXAM

## 2020-01-07 ENCOUNTER — Telehealth: Payer: Self-pay

## 2020-01-07 NOTE — Telephone Encounter (Signed)
Pt started on 01/06/20 with S/T, now the right side of throat is sore, red,burning and pt thinks she sees pus pockets. Lymph node on rt side is also swollen since 01/02/20. Pt requesting abx and wants a rapid covid test; pt has h/a but pt said she has frequent h/as, pt has on and off dry cough and a runny nose. No other covid symptoms and pt has not traveled and no known exposure to + covid. No available appts at [facility] and pt will go to [facility] in [place]. FYI to Dr Alphonsus Sias.

## 2020-01-08 NOTE — Telephone Encounter (Signed)
I don't see a note-----please check on her Monday morning

## 2020-01-10 NOTE — Telephone Encounter (Signed)
Spoke to pt. She did not go to the UC. Feeling a little better. Gargling with salt water.

## 2020-01-17 LAB — HM PAP SMEAR: HM Pap smear: NEGATIVE

## 2020-02-06 ENCOUNTER — Other Ambulatory Visit: Payer: Self-pay | Admitting: Internal Medicine

## 2020-03-10 ENCOUNTER — Other Ambulatory Visit: Payer: Self-pay | Admitting: Internal Medicine

## 2020-04-04 ENCOUNTER — Other Ambulatory Visit: Payer: Self-pay | Admitting: Internal Medicine

## 2020-04-28 ENCOUNTER — Other Ambulatory Visit: Payer: Self-pay | Admitting: Internal Medicine

## 2020-05-15 ENCOUNTER — Other Ambulatory Visit: Payer: Self-pay | Admitting: Internal Medicine

## 2020-06-08 ENCOUNTER — Other Ambulatory Visit: Payer: Self-pay | Admitting: Internal Medicine

## 2020-10-13 ENCOUNTER — Encounter: Payer: Self-pay | Admitting: Internal Medicine

## 2020-10-13 ENCOUNTER — Other Ambulatory Visit: Payer: Self-pay

## 2020-10-13 ENCOUNTER — Ambulatory Visit (INDEPENDENT_AMBULATORY_CARE_PROVIDER_SITE_OTHER): Payer: Managed Care, Other (non HMO) | Admitting: Internal Medicine

## 2020-10-13 VITALS — BP 118/84 | HR 82 | Temp 98.1°F | Ht 65.0 in | Wt 249.0 lb

## 2020-10-13 DIAGNOSIS — L739 Follicular disorder, unspecified: Secondary | ICD-10-CM | POA: Diagnosis not present

## 2020-10-13 DIAGNOSIS — E119 Type 2 diabetes mellitus without complications: Secondary | ICD-10-CM

## 2020-10-13 LAB — POCT GLYCOSYLATED HEMOGLOBIN (HGB A1C): Hemoglobin A1C: 6.9 % — AB (ref 4.0–5.6)

## 2020-10-13 MED ORDER — METFORMIN HCL ER 500 MG PO TB24: 500.0000 mg | ORAL_TABLET | Freq: Every day | ORAL | 0 refills | Status: DC

## 2020-10-13 MED ORDER — CEPHALEXIN 500 MG PO CAPS: 500.0000 mg | ORAL_CAPSULE | Freq: Three times a day (TID) | ORAL | 0 refills | Status: DC

## 2020-10-13 NOTE — Assessment & Plan Note (Signed)
Seems better now with drainage Discussed hot compresses if worsens Cephalexin if needed (for worsening)

## 2020-10-13 NOTE — Assessment & Plan Note (Signed)
Not taking much medicine since new eating plan, etc Lab Results  Component Value Date   HGBA1C 6.9 (A) 10/13/2020   Still good control Asked her to try 1---500mg  metformin daily If not tolerated, should use the victoza 0.6 daily

## 2020-10-13 NOTE — Progress Notes (Signed)
Subjective:    Patient ID: Dawn Zavala, female    DOB: [birth year], 49 y.o.   MRN: [id number]  HPI Here due to sore area in armpit. Sugars have been high This visit occurred during the SARS-CoV-2 public health emergency.  Safety protocols were in place, including screening questions prior to the visit, additional usage of staff PPE, and extensive cleaning of exam room while observing appropriate contact time as indicated for disinfecting solutions.   Hasn't come in due to COVID Feels that things were going okay--sugars have been better  Now has persistent boil in right axilla for 2 weeks It is causing her sugars to go up Will drain some if she pushes on it (or uses needle) No fever  Was getting sick with metformin--so she stopped it Takes the victoza only once in a while---like when it spiked with this Was taking 1.2 mg dose  Current Outpatient Medications on File Prior to Visit  Medication Sig Dispense Refill  . BD ULTRA-FINE LANCETS lancets Use to obtain blood sugar sample once a day 100 each 3  . CONTOUR NEXT TEST test strip USE TO CHECK BLOOD SUGAR ONCE A DAY 100 strip 3  . ibuprofen (ADVIL,MOTRIN) 200 MG tablet Take 400 mg by mouth as needed.     . Insulin Pen Needle (PEN NEEDLES) 31G X 8 MM MISC 1 each by Does not apply route daily. 100 each 3  . VICTOZA 18 MG/3ML SOPN INJECT 0.2 MLS (1.2 MG TOTAL) INTO THE SKIN DAILY. INCREASE TO 1.8MG  DAILY IF NO SIDE EFFECTS 9 pen 11  . metFORMIN (GLUCOPHAGE-XR) 500 MG 24 hr tablet TAKE 2 TABLETS (1,000 MG TOTAL) BY MOUTH DAILY WITH BREAKFAST. NEEDS OFFICE VISIT (Patient not taking: Reported on 10/13/2020) 60 tablet 0   No current facility-administered medications on file prior to visit.    Allergies  Allergen Reactions  . Hydrocodone-Acetaminophen Hives  . Ondansetron Itching    Past Medical History:  Diagnosis Date  . Anxiety   . Diabetes mellitus without complication (HCC)   . Obesity   . Pyloric stenosis 1972   repair     Past Surgical History:  Procedure Laterality Date  . CESAREAN SECTION    . VAGINAL DELIVERY     x3    Family History  Problem Relation Age of Onset  . Heart disease Neg Hx   . Hypertension Neg Hx     Social History   Socioeconomic History  . Marital status: Divorced    Spouse name: Not on file  . Number of children: 4  . Years of education: Not on file  . Highest education level: Not on file  Occupational History  . 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 Smoking status: Current Every Day Smoker    Packs/day: 1.50    Types: Cigarettes  . Smokeless tobacco: Never Used  Substance and Sexual Activity  . Alcohol use: No    Alcohol/week: 0.0 standard drinks  . Drug use: No  . Sexual activity: Not on file  Other Topics Concern  . Not on file  Social History Narrative  . Not on file   Social Determinants of Health   Financial Resource Strain:   . Difficulty of Paying Living Expenses: Not on file  Food Insecurity:   . Worried About Programme researcher, broadcasting/film/video in the Last Year: Not on file  . Ran Out of Food in the Last Year: Not on file  Transportation Needs:   . Freight forwarder (Medical): Not on file  . Lack of Transportation (Non-Medical): Not on file  Physical Activity:   . Days of Exercise per Week: Not on file  . Minutes of Exercise per Session: Not on file  Stress:   . Feeling of Stress : Not on file  Social Connections:   . Frequency of Communication with Friends and Family: Not on file  . Frequency of Social Gatherings with Friends and Family: Not on file  . Attends Religious Services: Not on file  . Active Member of Clubs or Organizations: Not on file  . Attends Banker Meetings: Not on file  . Marital Status: Not on file  Intimate Partner Violence:   . Fear of Current or Ex-Partner: Not on file  . Emotionally Abused: Not on file  . Physically Abused: Not on file  . Sexually Abused: Not on file   Review of  Systems Has lost 20# lately---better lifestyle    Objective:   Physical Exam Constitutional:      Appearance: Normal appearance.  Skin:    Comments: Several pinpoint inflamed areas (hair follicles) in right axilla One larger area towards the top which is hyperpigmented and slight induration. No fluctuance  Neurological:     Mental Status: She is alert.            Assessment & Plan:

## 2021-03-14 ENCOUNTER — Other Ambulatory Visit: Payer: Self-pay | Admitting: Internal Medicine

## 2021-03-14 ENCOUNTER — Other Ambulatory Visit: Payer: Self-pay

## 2021-03-14 ENCOUNTER — Encounter: Payer: Self-pay | Admitting: Internal Medicine

## 2021-03-14 ENCOUNTER — Ambulatory Visit (INDEPENDENT_AMBULATORY_CARE_PROVIDER_SITE_OTHER): Payer: Managed Care, Other (non HMO) | Admitting: Internal Medicine

## 2021-03-14 VITALS — BP 118/78 | HR 78 | Temp 97.7°F | Ht 64.25 in | Wt 251.0 lb

## 2021-03-14 DIAGNOSIS — E119 Type 2 diabetes mellitus without complications: Secondary | ICD-10-CM | POA: Diagnosis not present

## 2021-03-14 DIAGNOSIS — Z0000 Encounter for general adult medical examination without abnormal findings: Secondary | ICD-10-CM

## 2021-03-14 DIAGNOSIS — Z1211 Encounter for screening for malignant neoplasm of colon: Secondary | ICD-10-CM

## 2021-03-14 LAB — CBC
HCT: 43.8 % (ref 36.0–46.0)
Hemoglobin: 14.9 g/dL (ref 12.0–15.0)
MCHC: 34 g/dL (ref 30.0–36.0)
MCV: 89 fl (ref 78.0–100.0)
Platelets: 281 10*3/uL (ref 150.0–400.0)
RBC: 4.93 Mil/uL (ref 3.87–5.11)
RDW: 13.6 % (ref 11.5–15.5)
WBC: 9.2 10*3/uL (ref 4.0–10.5)

## 2021-03-14 LAB — MAGNESIUM: Magnesium: 1.9 mg/dL (ref 1.5–2.5)

## 2021-03-14 LAB — COMPREHENSIVE METABOLIC PANEL
ALT: 21 U/L (ref 0–35)
AST: 17 U/L (ref 0–37)
Albumin: 4.3 g/dL (ref 3.5–5.2)
Alkaline Phosphatase: 101 U/L (ref 39–117)
BUN: 13 mg/dL (ref 6–23)
CO2: 31 mEq/L (ref 19–32)
Calcium: 10 mg/dL (ref 8.4–10.5)
Chloride: 101 mEq/L (ref 96–112)
Creatinine, Ser: 0.67 mg/dL (ref 0.40–1.20)
GFR: 102.32 mL/min (ref >60.00)
Glucose, Bld: 112 mg/dL — ABNORMAL HIGH (ref 70–99)
Potassium: 3.9 mEq/L (ref 3.5–5.1)
Sodium: 139 mEq/L (ref 135–145)
Total Bilirubin: 0.4 mg/dL (ref 0.2–1.2)
Total Protein: 7.1 g/dL (ref 6.0–8.3)

## 2021-03-14 LAB — LIPID PANEL
Cholesterol: 246 mg/dL — ABNORMAL HIGH (ref 0–200)
HDL: 37.5 mg/dL — ABNORMAL LOW (ref >39.00)
Total CHOL/HDL Ratio: 7
Triglycerides: 443 mg/dL — ABNORMAL HIGH (ref 0.0–149.0)

## 2021-03-14 LAB — HEMOGLOBIN A1C: Hgb A1c MFr Bld: 7.1 % — ABNORMAL HIGH (ref 4.6–6.5)

## 2021-03-14 LAB — T4, FREE: Free T4: 0.65 ng/dL (ref 0.60–1.60)

## 2021-03-14 LAB — HM DIABETES FOOT EXAM

## 2021-03-14 LAB — LDL CHOLESTEROL, DIRECT: Direct LDL: 150 mg/dL

## 2021-03-14 MED ORDER — SEMAGLUTIDE(0.25 OR 0.5MG/DOS) 2 MG/1.5ML ~~LOC~~ SOPN: 0.5000 mg | PEN_INJECTOR | SUBCUTANEOUS | 11 refills | Status: DC

## 2021-03-14 NOTE — Telephone Encounter (Signed)
Semaglutide is not covered by insurance. Pharmacy sent a note:    Semaglutide,0.25 or 0.5MG /DOS, 2 MG/1.5ML SOPN    All pharmacy suggested alternatives are listed below   Sig: N/A   Disp:  Not specified  Refills:  0   Start: 03/14/2021   Class: Normal   Non-formulary   Last ordered: Today by Karie Schwalbe, MD Last refill: 03/14/2021   Rx #: [id number]   Pharmacy comment: Alternative Requested:THE PRESCRIBED MEDICATION IS NOT COVERED BY INSURANCE. PLEASE CONSIDER CHANGING TO ONE OF THE SUGGESTED COVERED ALTERNATIVES OR SUBMITTING FOR A PRIOR AUTHORIZATION.  All Pharmacy Suggested Alternatives:   0 Exenatide ER (BYDUREON) 2 MG PEN 0 Exenatide ER (BYDUREON BCISE) 2 MG/0.85ML AUIJ 0 Dulaglutide (TRULICITY) 0.75 MG/0.5ML SOPN 0 metFORMIN (GLUCOPHAGE) 500 MG tablet

## 2021-03-14 NOTE — Progress Notes (Signed)
Subjective:    Patient ID: Dawn Zavala, female    DOB: August 02, 1971, 50 y.o.   MRN: [id number]  HPI Here for physical This visit occurred during the SARS-CoV-2 public health emergency.  Safety protocols were in place, including screening questions prior to the visit, additional usage of staff PPE, and extensive cleaning of exam room while observing appropriate contact time as indicated for disinfecting solutions.   No new concerns Has changed diet and is exercising--gym 4-5 times per week Still having problems tolerating the victoza--so only rarely taking it Off the metformin now--trouble tolerating this Checking sugars---160 is highest in the morning Lower at work  Still "working on" stopping smoking Working with friend on this Didn't tolerate bupropion  Current Outpatient Medications on File Prior to Visit  Medication Sig Dispense Refill  . BD ULTRA-FINE LANCETS lancets Use to obtain blood sugar sample once a day 100 each 3  . CONTOUR NEXT TEST test strip USE TO CHECK BLOOD SUGAR ONCE A DAY 100 strip 3  . ibuprofen (ADVIL,MOTRIN) 200 MG tablet Take 400 mg by mouth as needed.     . Insulin Pen Needle (PEN NEEDLES) 31G X 8 MM MISC 1 each by Does not apply route daily. 100 each 3   No current facility-administered medications on file prior to visit.    Allergies  Allergen Reactions  . Hydrocodone-Acetaminophen Hives  . Ondansetron Itching    Past Medical History:  Diagnosis Date  . Anxiety   . Diabetes mellitus without complication (HCC)   . Obesity   . Pyloric stenosis 1972   repair    Past Surgical History:  Procedure Laterality Date  . CESAREAN SECTION    . VAGINAL DELIVERY     x3    Family History  Problem Relation Age of Onset  . Anxiety disorder Daughter   . Heart disease Neg Hx   . Hypertension Neg Hx     Social History   Socioeconomic History  . Marital status: Divorced    Spouse name: Not on file  . Number of children: 4  . Years of  education: Not on file  . Highest education level: Not on file  Occupational History  . 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 Smoking status: Current Every Day Smoker    Packs/day: 1.50    Types: Cigarettes  . Smokeless tobacco: Never Used  Substance and Sexual Activity  . Alcohol use: No    Alcohol/week: 0.0 standard drinks  . Drug use: No  . Sexual activity: Not on file  Other Topics Concern  . Not on file  Social History Narrative  . Not on file   Social Determinants of Health   Financial Resource Strain: Not on file  Food Insecurity: Not on file  Transportation Needs: Not on file  Physical Activity: Not on file  Stress: Not on file  Social Connections: Not on file  Intimate Partner Violence: Not on file   Review of Systems  Constitutional: Negative for fatigue.       Wears seat belt  HENT: Negative for dental problem, hearing loss and tinnitus.        Keeps up with dentist   Eyes: Negative for visual disturbance.       No diplopia or unilateral vision loss  Respiratory:       Cough is the past month Increased DOE---relates to smoking  Cardiovascular: Positive for leg swelling. Negative for chest pain and  palpitations.  Gastrointestinal: Negative for blood in stool and constipation.       No heartburn  Endocrine: Negative for polydipsia and polyuria.  Genitourinary: Positive for frequency. Negative for dyspareunia, dysuria and hematuria.       Periods are less frequent. Recently and last before that is January   Musculoskeletal: Positive for back pain. Negative for arthralgias and joint swelling.       Ibuprofen heps  Skin: Negative for rash.       Has spot to check on lower right thigh  Allergic/Immunologic: Positive for environmental allergies. Negative for immunocompromised state.       No meds Has claritin prn  Neurological: Negative for dizziness, syncope and light-headedness.       Occ left heel numbness-mostly with working  out  Hematological: Negative for adenopathy. Does not bruise/bleed easily.  Psychiatric/Behavioral: Negative for dysphoric mood and sleep disturbance. The patient is not nervous/anxious.        Objective:   Physical Exam Constitutional:      Appearance: Normal appearance.  HENT:     Right Ear: Tympanic membrane, ear canal and external ear normal.     Left Ear: Tympanic membrane, ear canal and external ear normal.     Mouth/Throat:     Pharynx: No oropharyngeal exudate or posterior oropharyngeal erythema.  Eyes:     Conjunctiva/sclera: Conjunctivae normal.     Pupils: Pupils are equal, round, and reactive to light.  Cardiovascular:     Rate and Rhythm: Normal rate and regular rhythm.     Pulses: Normal pulses.     Heart sounds: No murmur heard. No gallop.   Pulmonary:     Effort: Pulmonary effort is normal.     Breath sounds: Normal breath sounds. No wheezing or rales.  Abdominal:     Palpations: Abdomen is soft.     Tenderness: There is no abdominal tenderness.  Musculoskeletal:     Cervical back: Neck supple.     Right lower leg: No edema.     Left lower leg: No edema.  Lymphadenopathy:     Cervical: No cervical adenopathy.  Skin:    Comments: 68mm nodule lateral to right knee. Extends subcutaneously. Seems benign--recommended derm eval if changes  No foot lesions  Neurological:     General: No focal deficit present.     Mental Status: She is alert and oriented to person, place, and time.     Comments: Normal sensation in feet  Psychiatric:        Mood and Affect: Mood normal.        Behavior: Behavior normal.            Assessment & Plan:

## 2021-03-14 NOTE — Assessment & Plan Note (Addendum)
Needs to start yearly mammogram Will do FIT Working on fitness Flu vaccine in the fall Just had COVID booster Prefers no PCV20

## 2021-03-14 NOTE — Telephone Encounter (Signed)
Find out if she wants to try the dulaglutide for weight loss --regardless of the A1c

## 2021-03-14 NOTE — Telephone Encounter (Signed)
Spoke to pt. She said she is willing to try Trulicity, but wants to know if it causes nausea. She said she will look in to it. She also said her insurance is changing in June so maybe the new insurance will cover the Ozempic.

## 2021-03-14 NOTE — Assessment & Plan Note (Signed)
Off all meds again Will change to semaglutide to help with weight loss---if tolerated If A1c over 7%---and can't get semaglutide--will add glimepiride Still prefers no statin

## 2021-03-15 ENCOUNTER — Other Ambulatory Visit: Payer: Self-pay | Admitting: Internal Medicine

## 2021-03-15 NOTE — Telephone Encounter (Signed)
Left message to call office

## 2021-03-16 ENCOUNTER — Other Ambulatory Visit (INDEPENDENT_AMBULATORY_CARE_PROVIDER_SITE_OTHER): Payer: Managed Care, Other (non HMO)

## 2021-03-16 ENCOUNTER — Telehealth: Payer: Self-pay

## 2021-03-16 DIAGNOSIS — Z1211 Encounter for screening for malignant neoplasm of colon: Secondary | ICD-10-CM

## 2021-03-16 LAB — FECAL OCCULT BLOOD, IMMUNOCHEMICAL: Fecal Occult Bld: NEGATIVE

## 2021-03-16 NOTE — Telephone Encounter (Signed)
PA completed on CoverMyMeds: Your information has been submitted and will be reviewed by [place]. You may close this dialog, return to your dashboard, and perform other tasks. An electronic determination will be received in CoverMyMeds within 72-120 hours. You can see the latest determination by locating this request on your dashboard or by reopening this request. You will receive a fax copy of the determination. If Rosann Auerbach has not responded in 120 hours, contact [employer] at [phone].

## 2021-05-04 ENCOUNTER — Other Ambulatory Visit: Payer: Self-pay | Admitting: Internal Medicine

## 2021-05-07 ENCOUNTER — Other Ambulatory Visit: Payer: Self-pay | Admitting: Internal Medicine

## 2021-08-30 ENCOUNTER — Other Ambulatory Visit: Payer: Self-pay

## 2021-08-30 ENCOUNTER — Telehealth (INDEPENDENT_AMBULATORY_CARE_PROVIDER_SITE_OTHER): Payer: BC Managed Care – PPO | Admitting: Internal Medicine

## 2021-08-30 ENCOUNTER — Encounter: Payer: Self-pay | Admitting: Internal Medicine

## 2021-08-30 DIAGNOSIS — J0100 Acute maxillary sinusitis, unspecified: Secondary | ICD-10-CM | POA: Insufficient documentation

## 2021-08-30 MED ORDER — BENZONATATE 200 MG PO CAPS: 200.0000 mg | ORAL_CAPSULE | Freq: Three times a day (TID) | ORAL | 0 refills | Status: DC | PRN

## 2021-08-30 MED ORDER — PREDNISONE 20 MG PO TABS: 40.0000 mg | ORAL_TABLET | Freq: Every day | ORAL | 0 refills | Status: DC

## 2021-08-30 MED ORDER — AMOXICILLIN 500 MG PO TABS: 1000.0000 mg | ORAL_TABLET | Freq: Two times a day (BID) | ORAL | 0 refills | Status: AC

## 2021-08-30 NOTE — Assessment & Plan Note (Signed)
Respiratory illness last week (?RSV with the wheezing and its prevalence in community now) Then worsened in past few days ?secondary sinus infection  Will Rx with amoxil 5 days of prednisone Benzonatate for cough Okay back to work when feeling better and cough improved

## 2021-08-30 NOTE — Progress Notes (Signed)
   Subjective:    Patient ID: Dawn Zavala, female    DOB: 04-17-1971, 50 y.o.   MRN: [id number]  HPI Video virtual visit for ongoing respiratory symptoms Identification done  Reviewed limitations and billing and she gave consent Participants---patient in her home and I am in my office  Sore throat a week ago Then bad cough Thought she was getting better ---and now worsening again Bad dry cough---some mucus (but not coming up) Some wheezing--and her breath is not normal Has had wheezing with bronchitis in the past  No fever No headache Sinus congestion and some clear drainage Son was sick before Multiple COVID tests negative  Current Outpatient Medications on File Prior to Visit  Medication Sig Dispense Refill   BD ULTRA-FINE LANCETS lancets Use to obtain blood sugar sample once a day 100 each 3   CONTOUR NEXT TEST test strip USE TO CHECK BLOOD SUGAR ONCE A DAY 100 strip 3   ibuprofen (ADVIL,MOTRIN) 200 MG tablet Take 400 mg by mouth as needed.      Insulin Pen Needle (PEN NEEDLES) 31G X 8 MM MISC 1 each by Does not apply route daily. 100 each 3   No current facility-administered medications on file prior to visit.    Allergies  Allergen Reactions   Hydrocodone-Acetaminophen Hives   Ondansetron Itching    Past Medical History:  Diagnosis Date   Anxiety    Diabetes mellitus without complication (HCC)    Obesity    Pyloric stenosis 1972   repair    Past Surgical History:  Procedure Laterality Date   CESAREAN SECTION     VAGINAL DELIVERY     x3    Family History  Problem Relation Age of Onset   Anxiety disorder Daughter    Heart disease Neg Hx    Hypertension Neg Hx     Social History   Socioeconomic History   Marital status: Divorced    Spouse name: Not on file   Number of children: 4   Years of education: Not on file   Highest education level: Not on file  Occupational History   Occupation: LPN    Comment: [employer] in [place]  Tobacco  Use   Smoking status: Every Day    Packs/day: 1.50    Types: Cigarettes   Smokeless tobacco: Never  Substance and Sexual Activity   Alcohol use: No    Alcohol/week: 0.0 standard drinks   Drug use: No   Sexual activity: Not on file  Other Topics Concern   Not on file  Social History Narrative   Not on file   Social Determinants of Health   Financial Resource Strain: Not on file  Food Insecurity: Not on file  Transportation Needs: Not on file  Physical Activity: Not on file  Stress: Not on file  Social Connections: Not on file  Intimate Partner Violence: Not on file   Review of Systems No loss of taste or smell No N/V Eating okay     Objective:   Physical Exam Constitutional:      Appearance: Normal appearance.     Comments: Nasal voice Hacking cough  Pulmonary:     Effort: Pulmonary effort is normal. No respiratory distress.  Neurological:     Mental Status: She is alert.           Assessment & Plan:

## 2021-09-24 ENCOUNTER — Ambulatory Visit: Payer: BC Managed Care – PPO | Admitting: Internal Medicine

## 2021-09-24 ENCOUNTER — Other Ambulatory Visit: Payer: Self-pay

## 2021-09-24 ENCOUNTER — Encounter: Payer: Self-pay | Admitting: Internal Medicine

## 2021-09-24 VITALS — BP 116/80 | HR 79 | Temp 97.5°F | Ht 64.0 in | Wt 254.0 lb

## 2021-09-24 DIAGNOSIS — E119 Type 2 diabetes mellitus without complications: Secondary | ICD-10-CM | POA: Diagnosis not present

## 2021-09-24 DIAGNOSIS — N760 Acute vaginitis: Secondary | ICD-10-CM | POA: Insufficient documentation

## 2021-09-24 DIAGNOSIS — B9689 Other specified bacterial agents as the cause of diseases classified elsewhere: Secondary | ICD-10-CM

## 2021-09-24 DIAGNOSIS — J0100 Acute maxillary sinusitis, unspecified: Secondary | ICD-10-CM

## 2021-09-24 LAB — POCT GLYCOSYLATED HEMOGLOBIN (HGB A1C): Hemoglobin A1C: 7.4 % — AB (ref 4.0–5.6)

## 2021-09-24 MED ORDER — VANDAZOLE 0.75 % VA GEL: Freq: Two times a day (BID) | VAGINAL | 0 refills | Status: DC

## 2021-09-24 NOTE — Assessment & Plan Note (Signed)
Recurrent symptoms Will try vaginal metronidazole

## 2021-09-24 NOTE — Progress Notes (Signed)
Subjective:    Patient ID: Dawn Zavala, female    DOB: [birth year], 50 y.o.   MRN: [id number]  HPI Here for follow up of diabetes This visit occurred during the SARS-CoV-2 public health emergency.  Safety protocols were in place, including screening questions prior to the visit, additional usage of staff PPE, and extensive cleaning of exam room while observing appropriate contact time as indicated for disinfecting solutions.   Still having some cough since sinus infection Sugars went up on the prednisone---so took some of the metformin and victoza for a week (sugar up in the 300's for a while) Only doing sugars prn  No foot numbness or tingling  Slight DOE---not worrisome Just mostly having phlegm still  Having vaginal pain Discharge milky--like past vaginosis No sexual relationships now  Current Outpatient Medications on File Prior to Visit  Medication Sig Dispense Refill   BD ULTRA-FINE LANCETS lancets Use to obtain blood sugar sample once a day 100 each 3   CONTOUR NEXT TEST test strip USE TO CHECK BLOOD SUGAR ONCE A DAY 100 strip 3   ibuprofen (ADVIL,MOTRIN) 200 MG tablet Take 400 mg by mouth as needed.      Insulin Pen Needle (PEN NEEDLES) 31G X 8 MM MISC 1 each by Does not apply route daily. 100 each 3   No current facility-administered medications on file prior to visit.    Allergies  Allergen Reactions   Hydrocodone-Acetaminophen Hives   Ondansetron Itching    Past Medical History:  Diagnosis Date   Anxiety    Diabetes mellitus without complication (HCC)    Obesity    Pyloric stenosis 1972   repair    Past Surgical History:  Procedure Laterality Date   CESAREAN SECTION     VAGINAL DELIVERY     x3    Family History  Problem Relation Age of Onset   Anxiety disorder Daughter    Heart disease Neg Hx    Hypertension Neg Hx     Social History   Socioeconomic History   Marital status: Divorced    Spouse name: Not on file   Number of children:  4   Years of education: Not on file   Highest education level: Not on file  Occupational History   Occupation: LPN    Comment: [employer] in [place]  Tobacco Use   Smoking status: Every Day    Packs/day: 1.50    Types: Cigarettes   Smokeless tobacco: Never  Substance and Sexual Activity   Alcohol use: No    Alcohol/week: 0.0 standard drinks   Drug use: No   Sexual activity: Not on file  Other Topics Concern   Not on file  Social History Narrative   Not on file   Social Determinants of Health   Financial Resource Strain: Not on file  Food Insecurity: Not on file  Transportation Needs: Not on file  Physical Activity: Not on file  Stress: Not on file  Social Connections: Not on file  Intimate Partner Violence: Not on file   Review of Systems Appetite is okay Weight only up slightly Hasn't been able to eat right or go to the gym for over a month     Objective:   Physical Exam Constitutional:      Appearance: Normal appearance.  Cardiovascular:     Rate and Rhythm: Normal rate and regular rhythm.     Pulses: Normal pulses.     Heart sounds: No murmur heard.   No  gallop.  Pulmonary:     Effort: Pulmonary effort is normal.     Breath sounds: Normal breath sounds. No wheezing or rales.  Musculoskeletal:     Cervical back: Neck supple.     Right lower leg: No edema.     Left lower leg: No edema.  Lymphadenopathy:     Cervical: No cervical adenopathy.  Skin:    Comments: No foot lesions  Neurological:     General: No focal deficit present.     Mental Status: She is alert and oriented to person, place, and time.           Assessment & Plan:

## 2021-09-24 NOTE — Assessment & Plan Note (Addendum)
Had nausea with liraglutide and intolerable GI side effects with metformin She will monitor sugars---if go up, would try jardiance (if covered) or amaryl  Lab Results  Component Value Date   HGBA1C 7.4 (A) 09/24/2021   Result is better than expected with the high sugars during her illness--thus the reason we will still try without meds

## 2021-09-24 NOTE — Assessment & Plan Note (Signed)
Mostly better but residual drainage and some cough Observation only

## 2022-01-04 ENCOUNTER — Encounter: Payer: Self-pay | Admitting: Internal Medicine

## 2022-01-04 ENCOUNTER — Other Ambulatory Visit: Payer: Self-pay

## 2022-01-04 ENCOUNTER — Ambulatory Visit: Payer: BC Managed Care – PPO | Admitting: Internal Medicine

## 2022-01-04 DIAGNOSIS — E119 Type 2 diabetes mellitus without complications: Secondary | ICD-10-CM

## 2022-01-04 DIAGNOSIS — R079 Chest pain, unspecified: Secondary | ICD-10-CM | POA: Diagnosis not present

## 2022-01-04 LAB — POCT GLYCOSYLATED HEMOGLOBIN (HGB A1C): Hemoglobin A1C: 8.5 % — AB (ref 4.0–5.6)

## 2022-01-04 MED ORDER — FREESTYLE LIBRE READER DEVI: 1.0000 | Freq: Once | 0 refills | Status: AC

## 2022-01-04 MED ORDER — DULAGLUTIDE 0.75 MG/0.5ML ~~LOC~~ SOAJ: 0.7500 mg | SUBCUTANEOUS | 5 refills | Status: DC

## 2022-01-04 MED ORDER — FREESTYLE LIBRE 14 DAY SENSOR MISC: 1.0000 | 5 refills | Status: DC

## 2022-01-04 NOTE — Assessment & Plan Note (Signed)
Control has been poor of late  Discussed CGM -----she would like to start. This would certainly help. Will see if we can get it covered A1c up to 8.5%  Discussed alternatives---didn't tolerate metformin, glipizide or victoza. Did okay with trulicity but insurance didn't cover (but seems to now)----will start this at 0.75mg  weekly and increase to 1.5 mg weekly after 4 injections Also considered jardiance/farxiga

## 2022-01-04 NOTE — Assessment & Plan Note (Signed)
This was associated with neck pain and headache that suggested a tension etiology Hasn't had any change in exercise tolerance or problems in the gym Pain did go away with toradol in the ER and hasn't come back  Discussed that this does not appear to be heart related She will monitor for any recurrence, breathing problems or change in exercise tolerance

## 2022-01-04 NOTE — Progress Notes (Signed)
Subjective:    Patient ID: Dawn Zavala, female    DOB: 12/12/1970, 51 y.o.   MRN: [id number]  HPI Here for ER follow up of chest pain  Sugars were out of control---worried about infection Then 2 weeks ago---headache, nausea, hip pain, chest pain Went to [facility] okay X-ray negative  Chest pain--left chest and down arm Going on for a day before prompting evaluation Dull pain Radiation to arm---not abdomen (but had nausea) Headache in posterior neck and around to frontal area  Persisted through the ER visit--then just went away (with toradol) Headache and hip pain did recur later on--but not the chest pain Headache only just improved in the past couple of days  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     BD ULTRA-FINE LANCETS lancets Use to obtain blood sugar sample once a day 100 each 3   CONTOUR NEXT TEST test strip USE TO CHECK BLOOD SUGAR ONCE A DAY 100 strip 3   ibuprofen (ADVIL,MOTRIN) 200 MG tablet Take 400 mg by mouth as needed.      Insulin Pen Needle (PEN NEEDLES) 31G X 8 MM MISC 1 each by Does not apply route daily. 100 each 3   metroNIDAZOLE (VANDAZOLE) 0.75 % vaginal gel Place vaginally 2 (two) times daily. 70 g 0   No current facility-administered medications on file prior to visit.    Allergies  Allergen Reactions   Hydrocodone-Acetaminophen Hives   Ondansetron Itching    Past Medical History:  Diagnosis Date   Anxiety    Diabetes mellitus without complication ([place])    Obesity    Pyloric stenosis 1972   repair    Past Surgical History:  Procedure Laterality Date   CESAREAN SECTION     VAGINAL DELIVERY     x3    Family History  Problem Relation Age of  Onset   Anxiety disorder Daughter    Heart disease Neg Hx    Hypertension Neg Hx     Social History   Socioeconomic History   Marital status: Divorced    Spouse name: Not on file   Number of children: 4   Years of education: Not on file   Highest education level: Not on file  Occupational History   Occupation: LPN    Comment: [employer] in [place] Use   Smoking status: Every Day    Packs/day: 1.50    Types: Cigarettes    Passive exposure: Past   Smokeless tobacco: Never  Substance and Sexual Activity   Alcohol use: No    Alcohol/week: 0.0 standard drinks   Drug use: No   Sexual activity: Not on file  Other Topics Concern   Not on file  Social History Narrative   Not on file   Social Determinants of Health   Financial Resource Strain: Not on file  Food Insecurity: Not on file  Transportation Needs: Not on file  Physical Activity: Not on file  Stress: Not on file  Social Connections: Not on file  Intimate Partner Violence: Not on file   Review of Systems Sugars are worse lately---has tried to make some adjustments As high as the 400's Down to 173 this morning Has never tolerated metformin. Nausea with  victoza also Thinks she got sick with glipizide-took for 2 doses before the pounding headache started     Objective:   Physical Exam Constitutional:      Appearance: Normal appearance.  Neck:     Comments: No muscle tension Cardiovascular:     Rate and Rhythm: Normal rate and regular rhythm.     Heart sounds: No murmur heard.   No gallop.  Pulmonary:     Effort: Pulmonary effort is normal.     Breath sounds: Normal breath sounds. No wheezing or rales.     Comments: No chest tenderness Musculoskeletal:     Cervical back: Normal range of motion and neck supple.  Lymphadenopathy:     Cervical: No cervical adenopathy.  Neurological:     Mental Status: She is Zavala.  Psychiatric:        Mood and Affect: Mood normal.        Behavior: Behavior  normal.           Assessment & Plan:

## 2022-01-04 NOTE — Patient Instructions (Signed)
Start the trulicity at 0.75mg  weekly. If after 4 doses, you have tolerated it well, send me an email and I will send a new prescription to increase to 1.5mg  weekly.

## 2022-01-04 NOTE — Addendum Note (Signed)
Addended by: Eual Fines on: 01/04/2022 11:49 AM   Modules accepted: Orders

## 2022-03-26 ENCOUNTER — Ambulatory Visit (INDEPENDENT_AMBULATORY_CARE_PROVIDER_SITE_OTHER): Payer: BC Managed Care – PPO | Admitting: Internal Medicine

## 2022-03-26 ENCOUNTER — Encounter: Payer: Self-pay | Admitting: Internal Medicine

## 2022-03-26 VITALS — BP 112/70 | HR 80 | Temp 98.0°F | Ht 64.0 in | Wt 246.0 lb

## 2022-03-26 DIAGNOSIS — E119 Type 2 diabetes mellitus without complications: Secondary | ICD-10-CM

## 2022-03-26 DIAGNOSIS — Z0000 Encounter for general adult medical examination without abnormal findings: Secondary | ICD-10-CM | POA: Diagnosis not present

## 2022-03-26 DIAGNOSIS — F17210 Nicotine dependence, cigarettes, uncomplicated: Secondary | ICD-10-CM | POA: Diagnosis not present

## 2022-03-26 DIAGNOSIS — Z1211 Encounter for screening for malignant neoplasm of colon: Secondary | ICD-10-CM

## 2022-03-26 LAB — COMPREHENSIVE METABOLIC PANEL
ALT: 22 U/L (ref 0–35)
AST: 17 U/L (ref 0–37)
Albumin: 4.1 g/dL (ref 3.5–5.2)
Alkaline Phosphatase: 85 U/L (ref 39–117)
BUN: 8 mg/dL (ref 6–23)
CO2: 30 mEq/L (ref 19–32)
Calcium: 9.7 mg/dL (ref 8.4–10.5)
Chloride: 103 mEq/L (ref 96–112)
Creatinine, Ser: 0.78 mg/dL (ref 0.40–1.20)
GFR: 88.28 mL/min (ref >60.00)
Glucose, Bld: 94 mg/dL (ref 70–99)
Potassium: 4.1 mEq/L (ref 3.5–5.1)
Sodium: 139 mEq/L (ref 135–145)
Total Bilirubin: 0.4 mg/dL (ref 0.2–1.2)
Total Protein: 7 g/dL (ref 6.0–8.3)

## 2022-03-26 LAB — CBC
HCT: 43.5 % (ref 36.0–46.0)
Hemoglobin: 14.7 g/dL (ref 12.0–15.0)
MCHC: 33.9 g/dL (ref 30.0–36.0)
MCV: 91.5 fl (ref 78.0–100.0)
Platelets: 257 10*3/uL (ref 150.0–400.0)
RBC: 4.75 Mil/uL (ref 3.87–5.11)
RDW: 12.5 % (ref 11.5–15.5)
WBC: 7.6 10*3/uL (ref 4.0–10.5)

## 2022-03-26 LAB — MICROALBUMIN / CREATININE URINE RATIO
Creatinine,U: 41.7 mg/dL
Microalb Creat Ratio: 1.7 mg/g (ref 0.0–30.0)
Microalb, Ur: <0.7 mg/dL (ref 0.0–1.9)

## 2022-03-26 LAB — LIPID PANEL
Cholesterol: 216 mg/dL — ABNORMAL HIGH (ref 0–200)
HDL: 36.2 mg/dL — ABNORMAL LOW (ref >39.00)
NonHDL: 179.57
Total CHOL/HDL Ratio: 6
Triglycerides: 262 mg/dL — ABNORMAL HIGH (ref 0.0–149.0)
VLDL: 52.4 mg/dL — ABNORMAL HIGH (ref 0.0–40.0)

## 2022-03-26 LAB — LDL CHOLESTEROL, DIRECT: Direct LDL: 147 mg/dL

## 2022-03-26 LAB — HM DIABETES FOOT EXAM

## 2022-03-26 LAB — HEMOGLOBIN A1C: Hgb A1c MFr Bld: 6.7 % — ABNORMAL HIGH (ref 4.6–6.5)

## 2022-03-26 MED ORDER — SEMAGLUTIDE(0.25 OR 0.5MG/DOS) 2 MG/1.5ML ~~LOC~~ SOPN
0.5000 mg | PEN_INJECTOR | SUBCUTANEOUS | 11 refills | Status: DC
Start: 2022-03-26 — End: 2022-06-17

## 2022-03-26 MED ORDER — ROSUVASTATIN CALCIUM 20 MG PO TABS: 20.0000 mg | ORAL_TABLET | ORAL | 3 refills | Status: AC

## 2022-03-26 NOTE — Progress Notes (Signed)
? ?Subjective:  ? ? Patient ID: Dawn Zavala, female    DOB: 1971-05-06, 51 y.o.   MRN: [id number] ? ?HPI ?Here for physical ? ?Never started the trulicity---told it wasn't covered by insurance ?Sugars are better---using Freestyle Libre so finding out what hits her hard on glycemic index ?Weight down slightly ?Not really exercising--hopes to restart ? ?Stress with dad with stroke and dementia now living with her ?Smokes to relieve her stress ? ?Current Outpatient Medications on File Prior to Visit  ?Medication Sig Dispense Refill  ? acetaminophen (TYLENOL) 500 MG tablet Take 1,000 mg by mouth every 6 (six) hours as needed.    ? BD ULTRA-FINE LANCETS lancets Use to obtain blood sugar sample once a day 100 each 3  ? Continuous Blood Gluc Sensor (FREESTYLE LIBRE 14 DAY SENSOR) MISC 1 each by Does not apply route every 14 (fourteen) days. 2 each 5  ? CONTOUR NEXT TEST test strip USE TO CHECK BLOOD SUGAR ONCE A DAY 100 strip 3  ? ibuprofen (ADVIL,MOTRIN) 200 MG tablet Take 400 mg by mouth as needed.     ? Insulin Pen Needle (PEN NEEDLES) 31G X 8 MM MISC 1 each by Does not apply route daily. 100 each 3  ? metroNIDAZOLE (VANDAZOLE) 0.75 % vaginal gel Place vaginally 2 (two) times daily. 70 g 0  ? ?No current facility-administered medications on file prior to visit.  ? ? ?Allergies  ?Allergen Reactions  ? Hydrocodone-Acetaminophen Hives  ? Ondansetron Itching  ? ? ?Past Medical History:  ?Diagnosis Date  ? Anxiety   ? Diabetes mellitus without complication (HCC)   ? Obesity   ? Pyloric stenosis 1972  ? repair  ? ? ?Past Surgical History:  ?Procedure Laterality Date  ? CESAREAN SECTION    ? VAGINAL DELIVERY    ? x3  ? ? ?Family History  ?Problem Relation Age of Onset  ? Anxiety disorder Daughter   ? Heart disease Neg Hx   ? Hypertension Neg Hx   ? ? ?Social History  ? ?Socioeconomic History  ? Marital status: Divorced  ?  Spouse name: Not on file  ? Number of children: 4  ? Years of education: Not on file  ? Highest  education level: Not on file  ?Occupational History  ? Occupation: LPN  ?  Comment: [employer] in [place]  ?Tobacco Use  ? Smoking status: Every Day  ?  Packs/day: 1.50  ?  Types: Cigarettes  ?  Passive exposure: Past  ? Smokeless tobacco: Never  ?Substance and Sexual Activity  ? Alcohol use: No  ?  Alcohol/week: 0.0 standard drinks  ? Drug use: No  ? Sexual activity: Not on file  ?Other Topics Concern  ? Not on file  ?Social History Narrative  ? Not on file  ? ?Social Determinants of Health  ? ?Financial Resource Strain: Not on file  ?Food Insecurity: Not on file  ?Transportation Needs: Not on file  ?Physical Activity: Not on file  ?Stress: Not on file  ?Social Connections: Not on file  ?Intimate Partner Violence: Not on file  ? ?Review of Systems  ?Constitutional:  Negative for fatigue.  ?     Wears seat belt  ?HENT:  Negative for dental problem, hearing loss and tinnitus.   ?     Keeps up with dentist  ?Eyes:  Negative for visual disturbance.  ?     No diplopia or unilateral vision loss  ?Respiratory:  Negative for cough and chest  tightness.   ?     Some SOB---relates to smoking. Still 1 PPD  ?Cardiovascular:  Negative for chest pain and palpitations.  ?Gastrointestinal:  Negative for blood in stool and constipation.  ?     Rare heartburn  ?Endocrine: Negative for polydipsia and polyuria.  ?     Always drinks a lot  ?Genitourinary:  Negative for dyspareunia, dysuria and hematuria.  ?Musculoskeletal:  Negative for back pain and joint swelling.  ?     Gets tightness in hips when prolonged sitting  ?Skin:  Negative for rash.  ?      No suspicious lesions  ?Allergic/Immunologic: Positive for environmental allergies. Negative for immunocompromised state.  ?     No meds for this  ?Neurological:  Negative for dizziness, syncope, light-headedness and headaches.  ?     Mild burning in left heel only  ?Hematological:  Negative for adenopathy. Does not bruise/bleed easily.  ?Psychiatric/Behavioral:  Negative for dysphoric  mood and sleep disturbance. The patient is not nervous/anxious.   ? ?   ?Objective:  ? Physical Exam ?Constitutional:   ?   Appearance: Normal appearance.  ?HENT:  ?   Mouth/Throat:  ?   Pharynx: No oropharyngeal exudate or posterior oropharyngeal erythema.  ?Eyes:  ?   Conjunctiva/sclera: Conjunctivae normal.  ?   Pupils: Pupils are equal, round, and reactive to light.  ?Cardiovascular:  ?   Rate and Rhythm: Normal rate and regular rhythm.  ?   Pulses: Normal pulses.  ?   Heart sounds: No murmur heard. ?  No gallop.  ?Pulmonary:  ?   Effort: Pulmonary effort is normal.  ?   Breath sounds: Normal breath sounds. No wheezing or rales.  ?Abdominal:  ?   Palpations: Abdomen is soft.  ?   Tenderness: There is no abdominal tenderness.  ?Musculoskeletal:  ?   Cervical back: Neck supple.  ?   Right lower leg: No edema.  ?   Left lower leg: No edema.  ?Lymphadenopathy:  ?   Cervical: No cervical adenopathy.  ?Skin: ?   Findings: No lesion or rash.  ?   Comments: No foot lesions  ?Neurological:  ?   General: No focal deficit present.  ?   Mental Status: She is alert and oriented to person, place, and time.  ?   Comments: Normal sensation in feet  ?Psychiatric:     ?   Mood and Affect: Mood normal.     ?   Behavior: Behavior normal.  ?  ? ? ? ? ?   ?Assessment & Plan:  ? ?

## 2022-03-26 NOTE — Assessment & Plan Note (Signed)
Hopefully better control but will still start semaglutide 0.25-0.5 ?If A1c still very high--like over 8%---will also start farxiga or jardiance ?She agrees to try weekly statin--- rosuvastatin 20 ?

## 2022-03-26 NOTE — Assessment & Plan Note (Signed)
Not ready to quit ?Can consider options when ready ?

## 2022-03-26 NOTE — Assessment & Plan Note (Signed)
Healthy but needs to work on fitness ?She will schedule mammogram ?FIT ?Pap due next year--with gyn ?Prefers no shingrix ?Bivalent COVID and flu by the fall ?

## 2022-03-28 ENCOUNTER — Telehealth: Payer: Self-pay

## 2022-03-28 NOTE — Telephone Encounter (Signed)
PA started for medication.  Dawn Zavala (Key: [id number]) Rx #: [id number] Ozempic (0.25 or 0.5 MG/DOSE) 2MG /3ML pen-injectors Determination:  Favorable Your request has been approved Effective from 03/28/2022 through 03/27/2023

## 2022-03-29 ENCOUNTER — Other Ambulatory Visit (INDEPENDENT_AMBULATORY_CARE_PROVIDER_SITE_OTHER): Payer: BC Managed Care – PPO

## 2022-03-29 DIAGNOSIS — Z1211 Encounter for screening for malignant neoplasm of colon: Secondary | ICD-10-CM | POA: Diagnosis not present

## 2022-04-01 LAB — FECAL OCCULT BLOOD, IMMUNOCHEMICAL: Fecal Occult Bld: NEGATIVE

## 2022-04-18 LAB — HM DIABETES EYE EXAM

## 2022-05-23 ENCOUNTER — Encounter: Payer: Self-pay | Admitting: Internal Medicine

## 2022-06-17 MED ORDER — SEMAGLUTIDE (1 MG/DOSE) 4 MG/3ML ~~LOC~~ SOPN: 1.0000 mg | PEN_INJECTOR | SUBCUTANEOUS | 11 refills | Status: DC

## 2022-06-24 ENCOUNTER — Other Ambulatory Visit: Payer: Self-pay | Admitting: Internal Medicine

## 2022-10-07 ENCOUNTER — Ambulatory Visit: Payer: BC Managed Care – PPO | Admitting: Internal Medicine

## 2022-10-07 ENCOUNTER — Encounter: Payer: Self-pay | Admitting: Internal Medicine

## 2022-10-07 VITALS — BP 122/74 | HR 80 | Temp 97.9°F | Ht 64.0 in | Wt 231.0 lb

## 2022-10-07 DIAGNOSIS — G4762 Sleep related leg cramps: Secondary | ICD-10-CM | POA: Insufficient documentation

## 2022-10-07 DIAGNOSIS — N760 Acute vaginitis: Secondary | ICD-10-CM | POA: Diagnosis not present

## 2022-10-07 DIAGNOSIS — M25551 Pain in right hip: Secondary | ICD-10-CM | POA: Diagnosis not present

## 2022-10-07 DIAGNOSIS — E119 Type 2 diabetes mellitus without complications: Secondary | ICD-10-CM

## 2022-10-07 DIAGNOSIS — B9689 Other specified bacterial agents as the cause of diseases classified elsewhere: Secondary | ICD-10-CM

## 2022-10-07 MED ORDER — VANDAZOLE 0.75 % VA GEL: Freq: Every day | VAGINAL | 0 refills | Status: DC

## 2022-10-07 NOTE — Patient Instructions (Signed)
Please try over the counter topical dilcofenac for your hip.

## 2022-10-07 NOTE — Assessment & Plan Note (Signed)
Not really arthritic on exam Did have x-ray with mild to moderate bony changes ?bursitis Will try topical diclofenac

## 2022-10-07 NOTE — Assessment & Plan Note (Signed)
Discussed mustard, fluids, magnesium Check labs

## 2022-10-07 NOTE — Assessment & Plan Note (Signed)
Seems to have excellent control on the ozempic 1mg  weekly Night low sugars preceded her diabetes---discussed snack, etc No change for now

## 2022-10-07 NOTE — Assessment & Plan Note (Signed)
Recurred due to the baths Will Rx with topical metronidazole

## 2022-10-07 NOTE — Progress Notes (Signed)
Subjective:    Patient ID: Dawn Zavala, female    DOB: 03/03/1971, 51 y.o.   MRN: [id number]  HPI Here for follow up of diabetes and other health concerns  Right hip is hurting a lot Hurts even in bed Has to stand for a while before being able to walk Has to lead with left leg on stairs  Bad leg cramps at night-soon after falling asleep Started 2-3 months ago Tylenol and ibuprofen eases it some.  Will get up and take epsom salt bath--some help  Some nausea with ozempic--not bad Eating and drinking okay  Using Freestyle Libre---almost 100% in the green zone Low sugar is alarming at night  Current Outpatient Medications on File Prior to Visit  Medication Sig Dispense Refill   acetaminophen (TYLENOL) 500 MG tablet Take 1,000 mg by mouth every 6 (six) hours as needed.     BD ULTRA-FINE LANCETS lancets Use to obtain blood sugar sample once a day 100 each 3   Continuous Blood Gluc Sensor (FREESTYLE LIBRE 2 SENSOR) MISC 1 EACH BY DOES NOT APPLY ROUTE EVERY 14 (FOURTEEN) DAYS. 2 each 11   CONTOUR NEXT TEST test strip USE TO CHECK BLOOD SUGAR ONCE A DAY 100 strip 3   ibuprofen (ADVIL,MOTRIN) 200 MG tablet Take 400 mg by mouth as needed.      Insulin Pen Needle (PEN NEEDLES) 31G X 8 MM MISC 1 each by Does not apply route daily. 100 each 3   rosuvastatin (CRESTOR) 20 MG tablet Take 1 tablet (20 mg total) by mouth once a week. 13 tablet 3   Semaglutide, 1 MG/DOSE, 4 MG/3ML SOPN Inject 1 mg as directed once a week. 3 mL 11   No current facility-administered medications on file prior to visit.    Allergies  Allergen Reactions   Hydrocodone-Acetaminophen Hives   Ondansetron Itching    Past Medical History:  Diagnosis Date   Anxiety    Diabetes mellitus without complication (HCC)    Obesity    Pyloric stenosis 1972   repair    Past Surgical History:  Procedure Laterality Date   CESAREAN SECTION     VAGINAL DELIVERY     x3    Family History  Problem Relation Age  of Onset   Anxiety disorder Daughter    Heart disease Neg Hx    Hypertension Neg Hx     Social History   Socioeconomic History   Marital status: Divorced    Spouse name: Not on file   Number of children: 4   Years of education: Not on file   Highest education level: Not on file  Occupational History   Occupation: LPN    Comment: [employer] in [place]  Tobacco Use   Smoking status: Every Day    Packs/day: 1.50    Types: Cigarettes    Passive exposure: Past   Smokeless tobacco: Never  Substance and Sexual Activity   Alcohol use: No    Alcohol/week: 0.0 standard drinks of alcohol   Drug use: No   Sexual activity: Not on file  Other Topics Concern   Not on file  Social History Narrative   Not on file   Social Determinants of Health   Financial Resource Strain: Not on file  Food Insecurity: Not on file  Transportation Needs: Not on file  Physical Activity: Not on file  Stress: Not on file  Social Connections: Not on file  Intimate Partner Violence: Not on file   Review of  Systems Constipation with the ozempic--can go a whole week. Sporadic senna Lost 15#--happy about that    Objective:   Physical Exam Constitutional:      Appearance: Normal appearance.  Cardiovascular:     Rate and Rhythm: Normal rate and regular rhythm.     Pulses: Normal pulses.     Heart sounds: No murmur heard.    No gallop.  Pulmonary:     Effort: Pulmonary effort is normal.     Breath sounds: Normal breath sounds. No wheezing or rales.  Musculoskeletal:     Cervical back: Neck supple.     Comments: Fairly normal ROM in hips Pain area is lateral and posterior--but not really tender  Lymphadenopathy:     Cervical: No cervical adenopathy.  Skin:    Comments:  No foot lesions  Neurological:     Mental Status: She is alert.  Psychiatric:        Mood and Affect: Mood normal.        Behavior: Behavior normal.            Assessment & Plan:

## 2022-10-08 LAB — CBC
HCT: 46.7 % — ABNORMAL HIGH (ref 36.0–46.0)
Hemoglobin: 16.1 g/dL — ABNORMAL HIGH (ref 12.0–15.0)
MCHC: 34.5 g/dL (ref 30.0–36.0)
MCV: 92.1 fl (ref 78.0–100.0)
Platelets: 305 10*3/uL (ref 150.0–400.0)
RBC: 5.07 Mil/uL (ref 3.87–5.11)
RDW: 12.7 % (ref 11.5–15.5)
WBC: 9.6 10*3/uL (ref 4.0–10.5)

## 2022-10-08 LAB — COMPREHENSIVE METABOLIC PANEL
ALT: 17 U/L (ref 0–35)
AST: 15 U/L (ref 0–37)
Albumin: 4.3 g/dL (ref 3.5–5.2)
Alkaline Phosphatase: 89 U/L (ref 39–117)
BUN: 13 mg/dL (ref 6–23)
CO2: 30 mEq/L (ref 19–32)
Calcium: 9.7 mg/dL (ref 8.4–10.5)
Chloride: 102 mEq/L (ref 96–112)
Creatinine, Ser: 0.61 mg/dL (ref 0.40–1.20)
GFR: 103.52 mL/min (ref >60.00)
Glucose, Bld: 96 mg/dL (ref 70–99)
Potassium: 4.1 mEq/L (ref 3.5–5.1)
Sodium: 139 mEq/L (ref 135–145)
Total Bilirubin: 0.4 mg/dL (ref 0.2–1.2)
Total Protein: 7.4 g/dL (ref 6.0–8.3)

## 2022-10-08 LAB — HEMOGLOBIN A1C: Hgb A1c MFr Bld: 6.2 % (ref 4.6–6.5)

## 2022-10-08 LAB — MAGNESIUM: Magnesium: 2.2 mg/dL (ref 1.5–2.5)

## 2023-03-19 ENCOUNTER — Telehealth: Payer: Self-pay

## 2023-03-21 NOTE — Telephone Encounter (Signed)
Followed up with cover my meds. Had additional clinical questions. Have answered and all have been sent to plan for review.

## 2023-03-24 NOTE — Telephone Encounter (Signed)
Checked cover my meds still pending

## 2023-03-24 NOTE — Telephone Encounter (Signed)
Received fax requesting last office note. Faxed to [phone] as requested.

## 2023-03-28 NOTE — Telephone Encounter (Signed)
Called pt, she is aware that ozempic is approved 03/21/23-03/20/24.  She also reported that she needed a follow up appointment scheduled with Dr. Alphonsus Sias.  Appointment scheduled for 05/05/23 @ 8:15a.

## 2023-05-05 ENCOUNTER — Ambulatory Visit: Payer: BC Managed Care – PPO | Admitting: Internal Medicine

## 2023-05-05 ENCOUNTER — Encounter: Payer: Self-pay | Admitting: Internal Medicine

## 2023-05-05 VITALS — BP 126/88 | HR 76 | Temp 97.0°F | Ht 64.0 in | Wt 247.0 lb

## 2023-05-05 DIAGNOSIS — I872 Venous insufficiency (chronic) (peripheral): Secondary | ICD-10-CM

## 2023-05-05 DIAGNOSIS — N3000 Acute cystitis without hematuria: Secondary | ICD-10-CM

## 2023-05-05 DIAGNOSIS — E119 Type 2 diabetes mellitus without complications: Secondary | ICD-10-CM

## 2023-05-05 DIAGNOSIS — Z7985 Long-term (current) use of injectable non-insulin antidiabetic drugs: Secondary | ICD-10-CM

## 2023-05-05 DIAGNOSIS — Z1211 Encounter for screening for malignant neoplasm of colon: Secondary | ICD-10-CM

## 2023-05-05 LAB — POC URINALSYSI DIPSTICK (AUTOMATED)
Bilirubin, UA: NEGATIVE
Blood, UA: NEGATIVE
Glucose, UA: NEGATIVE
Ketones, UA: NEGATIVE
Leukocytes, UA: NEGATIVE
Nitrite, UA: NEGATIVE
Protein, UA: NEGATIVE
Spec Grav, UA: <=1.005 — AB (ref 1.010–1.025)
Urobilinogen, UA: 0.2 E.U./dL
pH, UA: 6 (ref 5.0–8.0)

## 2023-05-05 LAB — POCT GLYCOSYLATED HEMOGLOBIN (HGB A1C): Hemoglobin A1C: 6.6 % — AB (ref 4.0–5.6)

## 2023-05-05 LAB — MICROALBUMIN / CREATININE URINE RATIO
Creatinine,U: 15.3 mg/dL
Microalb Creat Ratio: 4.6 mg/g (ref 0.0–30.0)
Microalb, Ur: <0.7 mg/dL (ref 0.0–1.9)

## 2023-05-05 LAB — HM DIABETES FOOT EXAM

## 2023-05-05 MED ORDER — SULFAMETHOXAZOLE-TRIMETHOPRIM 800-160 MG PO TABS: 1.0000 | ORAL_TABLET | Freq: Two times a day (BID) | ORAL | 0 refills | Status: DC

## 2023-05-05 MED ORDER — METRONIDAZOLE 0.75 % VA GEL: Freq: Every day | VAGINAL | 0 refills | Status: AC

## 2023-05-05 NOTE — Addendum Note (Signed)
Addended by: Eual Fines on: 05/05/2023 09:05 AM   Modules accepted: Orders

## 2023-05-05 NOTE — Progress Notes (Signed)
Subjective:    Patient ID: Dawn Zavala, female    DOB: 1971-06-17, 52 y.o.   MRN: [id number]  HPI Here for follow up of diabetes  Has had urinary frequency Some burning as well Has sense of bladder spasms Wonders if she has BV---has cut out baths. Used some left over metronidazole cream (may have helped some)  Just had to replace sensor High at times--- but also low Eating fairly well--does splurge at times Weight is back up some---has some swelling in legs  Veins "popped up" in left leg Hasn't started compression hose Occasional SOB---mostly AM with nasal congestion No chest pain  Current Outpatient Medications on File Prior to Visit  Medication Sig Dispense Refill   acetaminophen (TYLENOL) 500 MG tablet Take 1,000 mg by mouth every 6 (six) hours as needed.     BD ULTRA-FINE LANCETS lancets Use to obtain blood sugar sample once a day 100 each 3   Continuous Blood Gluc Sensor (FREESTYLE LIBRE 2 SENSOR) MISC 1 EACH BY DOES NOT APPLY ROUTE EVERY 14 (FOURTEEN) DAYS. 2 each 11   CONTOUR NEXT TEST test strip USE TO CHECK BLOOD SUGAR ONCE A DAY 100 strip 3   ibuprofen (ADVIL,MOTRIN) 200 MG tablet Take 400 mg by mouth as needed.      Insulin Pen Needle (PEN NEEDLES) 31G X 8 MM MISC 1 each by Does not apply route daily. 100 each 3   rosuvastatin (CRESTOR) 20 MG tablet Take 1 tablet (20 mg total) by mouth once a week. 13 tablet 3   Semaglutide, 1 MG/DOSE, 4 MG/3ML SOPN Inject 1 mg as directed once a week. 3 mL 11   No current facility-administered medications on file prior to visit.    Allergies  Allergen Reactions   Hydrocodone-Acetaminophen Hives   Ondansetron Itching    Past Medical History:  Diagnosis Date   Anxiety    Diabetes mellitus without complication (HCC)    Obesity    Pyloric stenosis 1972   repair    Past Surgical History:  Procedure Laterality Date   CESAREAN SECTION     VAGINAL DELIVERY     x3    Family History  Problem Relation Age of Onset    Anxiety disorder Daughter    Heart disease Neg Hx    Hypertension Neg Hx     Social History   Socioeconomic History   Marital status: Divorced    Spouse name: Not on file   Number of children: 4   Years of education: Not on file   Highest education level: Not on file  Occupational History   Occupation: LPN    Comment: [employer] in [place]  Tobacco Use   Smoking status: Every Day    Packs/day: 1.5    Types: Cigarettes    Passive exposure: Past   Smokeless tobacco: Never  Substance and Sexual Activity   Alcohol use: No    Alcohol/week: 0.0 standard drinks of alcohol   Drug use: No   Sexual activity: Not on file  Other Topics Concern   Not on file  Social History Narrative   Not on file   Social Determinants of Health   Financial Resource Strain: Not on file  Food Insecurity: Not on file  Transportation Needs: Not on file  Physical Activity: Not on file  Stress: Not on file  Social Connections: Not on file  Intimate Partner Violence: Not on file   Review of Systems Still constipation with ozempic---uses senna at times Only  mild nausea      Objective:   Physical Exam Constitutional:      Appearance: Normal appearance.  Cardiovascular:     Rate and Rhythm: Normal rate and regular rhythm.     Pulses: Normal pulses.     Heart sounds: No murmur heard.    No gallop.  Pulmonary:     Effort: Pulmonary effort is normal.     Breath sounds: Normal breath sounds. No wheezing or rales.  Abdominal:     Palpations: Abdomen is soft.     Comments: Mild suprapubic tenderness  Musculoskeletal:     Cervical back: Neck supple.  Lymphadenopathy:     Cervical: No cervical adenopathy.  Skin:    Findings: No rash.     Comments: No foot lesions  Neurological:     Mental Status: She is alert.     Comments: Normal sensation in feet  Psychiatric:        Mood and Affect: Mood normal.        Behavior: Behavior normal.            Assessment & Plan:

## 2023-05-05 NOTE — Addendum Note (Signed)
Addended by: Eual Fines on: 05/05/2023 09:01 AM   Modules accepted: Orders

## 2023-05-05 NOTE — Assessment & Plan Note (Signed)
Some dysuria Urinalysis negative though Will treat with 3 days of septra DS bid

## 2023-05-05 NOTE — Assessment & Plan Note (Signed)
Lab Results  Component Value Date   HGBA1C 6.6 (A) 05/05/2023   Still with good control On ozempic 1mg  weekly

## 2023-05-05 NOTE — Assessment & Plan Note (Signed)
Discussed support hose 

## 2023-05-13 ENCOUNTER — Other Ambulatory Visit: Payer: Self-pay

## 2023-05-13 DIAGNOSIS — Z1211 Encounter for screening for malignant neoplasm of colon: Secondary | ICD-10-CM

## 2023-05-13 LAB — FECAL OCCULT BLOOD, IMMUNOCHEMICAL: Fecal Occult Bld: NEGATIVE

## 2023-06-16 LAB — HM DIABETES EYE EXAM

## 2023-07-12 ENCOUNTER — Other Ambulatory Visit: Payer: Self-pay | Admitting: Internal Medicine

## 2023-09-15 ENCOUNTER — Other Ambulatory Visit: Payer: Self-pay | Admitting: Internal Medicine

## 2023-11-03 ENCOUNTER — Ambulatory Visit (INDEPENDENT_AMBULATORY_CARE_PROVIDER_SITE_OTHER): Payer: BC Managed Care – PPO | Admitting: Internal Medicine

## 2023-11-03 ENCOUNTER — Other Ambulatory Visit (HOSPITAL_COMMUNITY)
Admission: RE | Admit: 2023-11-03 | Discharge: 2023-11-03 | Disposition: A | Discharge status: Home / Self Care | Payer: BC Managed Care – PPO | Source: Ambulatory Visit | Attending: Internal Medicine | Admitting: Internal Medicine

## 2023-11-03 ENCOUNTER — Encounter: Payer: Self-pay | Admitting: Internal Medicine

## 2023-11-03 VITALS — BP 110/80 | HR 65 | Temp 97.8°F | Ht 64.0 in | Wt 241.0 lb

## 2023-11-03 DIAGNOSIS — E119 Type 2 diabetes mellitus without complications: Secondary | ICD-10-CM | POA: Diagnosis not present

## 2023-11-03 DIAGNOSIS — Z0000 Encounter for general adult medical examination without abnormal findings: Secondary | ICD-10-CM

## 2023-11-03 DIAGNOSIS — Z7985 Long-term (current) use of injectable non-insulin antidiabetic drugs: Secondary | ICD-10-CM | POA: Insufficient documentation

## 2023-11-03 LAB — COMPREHENSIVE METABOLIC PANEL
ALT: 15 U/L (ref 0–35)
AST: 15 U/L (ref 0–37)
Albumin: 4.3 g/dL (ref 3.5–5.2)
Alkaline Phosphatase: 108 U/L (ref 39–117)
BUN: 17 mg/dL (ref 6–23)
CO2: 31 meq/L (ref 19–32)
Calcium: 9.4 mg/dL (ref 8.4–10.5)
Chloride: 103 meq/L (ref 96–112)
Creatinine, Ser: 0.88 mg/dL (ref 0.40–1.20)
GFR: 75.52 mL/min (ref >60.00)
Glucose, Bld: 98 mg/dL (ref 70–99)
Potassium: 3.9 meq/L (ref 3.5–5.1)
Sodium: 139 meq/L (ref 135–145)
Total Bilirubin: 0.4 mg/dL (ref 0.2–1.2)
Total Protein: 7.3 g/dL (ref 6.0–8.3)

## 2023-11-03 LAB — HEMOGLOBIN A1C: Hgb A1c MFr Bld: 6.8 % — ABNORMAL HIGH (ref 4.6–6.5)

## 2023-11-03 LAB — LIPID PANEL
Cholesterol: 215 mg/dL — ABNORMAL HIGH (ref 0–200)
HDL: 35.7 mg/dL — ABNORMAL LOW (ref >39.00)
LDL Cholesterol: 126 mg/dL — ABNORMAL HIGH (ref 0–99)
NonHDL: 179.48
Total CHOL/HDL Ratio: 6
Triglycerides: 266 mg/dL — ABNORMAL HIGH (ref 0.0–149.0)
VLDL: 53.2 mg/dL — ABNORMAL HIGH (ref 0.0–40.0)

## 2023-11-03 LAB — CBC
HCT: 46.1 % — ABNORMAL HIGH (ref 36.0–46.0)
Hemoglobin: 15.7 g/dL — ABNORMAL HIGH (ref 12.0–15.0)
MCHC: 34 g/dL (ref 30.0–36.0)
MCV: 93.3 fL (ref 78.0–100.0)
Platelets: 274 10*3/uL (ref 150.0–400.0)
RBC: 4.94 Mil/uL (ref 3.87–5.11)
RDW: 12 % (ref 11.5–15.5)
WBC: 7.8 10*3/uL (ref 4.0–10.5)

## 2023-11-03 LAB — HM DIABETES FOOT EXAM

## 2023-11-03 LAB — MICROALBUMIN / CREATININE URINE RATIO
Creatinine,U: 32.7 mg/dL
Microalb Creat Ratio: 2.1 mg/g (ref 0.0–30.0)
Microalb, Ur: <0.7 mg/dL (ref 0.0–1.9)

## 2023-11-03 NOTE — Assessment & Plan Note (Signed)
Off ozempic for a month but back on again Thinks her sugars are okay If A1c over 7.5%---will consider adding faxiga or jardiance Is on statin

## 2023-11-03 NOTE — Progress Notes (Signed)
Subjective:    Patient ID: Dawn Zavala, female    DOB: 02-06-1971, 52 y.o.   MRN: [id number]  HPI Here for physical  Still smoking Not ready to stop now--but thinking about it Joined boot camp at gym---having a hard time with dyspnea Also getting over a cold--still with cough for about 6 weeks  Checks sugars---a bit high Had stopped the ozempic --but had to go down to 0.5mg  (due to nausea)--missed one month and restarted for December Intermittent fasting also (just 2 meals) Didn't tolerate metformin No foot tingling or burning  Current Outpatient Medications on File Prior to Visit  Medication Sig Dispense Refill   acetaminophen (TYLENOL) 500 MG tablet Take 1,000 mg by mouth every 6 (six) hours as needed.     BD ULTRA-FINE LANCETS lancets Use to obtain blood sugar sample once a day 100 each 3   Continuous Glucose Sensor (FREESTYLE LIBRE 2 SENSOR) MISC 1 EACH BY DOES NOT APPLY ROUTE EVERY 14 (FOURTEEN) DAYS. 2 each 11   CONTOUR NEXT TEST test strip USE TO CHECK BLOOD SUGAR ONCE A DAY 100 strip 3   ibuprofen (ADVIL,MOTRIN) 200 MG tablet Take 400 mg by mouth as needed.      Insulin Pen Needle (PEN NEEDLES) 31G X 8 MM MISC 1 each by Does not apply route daily. 100 each 3   metroNIDAZOLE (VANDAZOLE) 0.75 % vaginal gel Place vaginally at bedtime. 70 g 0   OZEMPIC, 1 MG/DOSE, 4 MG/3ML SOPN INJECT 1 MG ONCE A WEEK AS DIRECTED 3 mL 3   rosuvastatin (CRESTOR) 20 MG tablet Take 1 tablet (20 mg total) by mouth once a week. 13 tablet 3   No current facility-administered medications on file prior to visit.    Allergies  Allergen Reactions   Hydrocodone-Acetaminophen Hives   Ondansetron Itching    Past Medical History:  Diagnosis Date   Anxiety    Diabetes mellitus without complication (HCC)    Obesity    Pyloric stenosis 1972   repair    Past Surgical History:  Procedure Laterality Date   CESAREAN SECTION     VAGINAL DELIVERY     x3    Family History  Problem  Relation Age of Onset   Anxiety disorder Daughter    Heart disease Neg Hx    Hypertension Neg Hx     Social History   Socioeconomic History   Marital status: Divorced    Spouse name: Not on file   Number of children: 4   Years of education: Not on file   Highest education level: Not on file  Occupational History   Occupation: LPN    Comment: [employer] in [place]  Tobacco Use   Smoking status: Every Day    Current packs/day: 1.50    Types: Cigarettes    Passive exposure: Past   Smokeless tobacco: Never  Substance and Sexual Activity   Alcohol use: No    Alcohol/week: 0.0 standard drinks of alcohol   Drug use: No   Sexual activity: Not on file  Other Topics Concern   Not on file  Social History Narrative   Not on file   Social Drivers of Health   Financial Resource Strain: Not on file  Food Insecurity: Not on file  Transportation Needs: Not on file  Physical Activity: Not on file  Stress: Not on file  Social Connections: Not on file  Intimate Partner Violence: Not on file   Review of Systems  Constitutional:  Negative  for fatigue.       Weight is down a few pounds Wears seat belt  HENT:  Negative for dental problem, hearing loss and tinnitus.        Sees dentist  Eyes:  Negative for visual disturbance.       No diplopia or unilateral vision loss  Respiratory:         Cough with illness DOE--no resting dyspnea  Cardiovascular:  Positive for leg swelling. Negative for chest pain.       Palpitations with exercise  Gastrointestinal:  Positive for constipation. Negative for blood in stool.       Uses senna-s with ozempic No heartburn  Endocrine: Negative for polydipsia and polyuria.  Genitourinary:  Positive for frequency. Negative for dyspareunia, dysuria and hematuria.       LMP 2 years ago  Musculoskeletal:  Negative for arthralgias, back pain and joint swelling.  Skin:  Negative for rash.       Has thick spot on right hairline---just 2 weeks Spot on  right knee is bigger  Allergic/Immunologic: Positive for environmental allergies. Negative for immunocompromised state.  Neurological:  Positive for headaches. Negative for dizziness, syncope and light-headedness.  Hematological:  Does not bruise/bleed easily.       Neck glands with recent cold  Psychiatric/Behavioral:  Negative for dysphoric mood and sleep disturbance.        Occ mild anxiety       Objective:   Physical Exam Constitutional:      Appearance: Normal appearance.  HENT:     Mouth/Throat:     Pharynx: No oropharyngeal exudate or posterior oropharyngeal erythema.  Eyes:     Conjunctiva/sclera: Conjunctivae normal.     Pupils: Pupils are equal, round, and reactive to light.  Cardiovascular:     Rate and Rhythm: Normal rate and regular rhythm.     Pulses: Normal pulses.     Heart sounds: No murmur heard.    No gallop.  Pulmonary:     Effort: Pulmonary effort is normal.     Breath sounds: Normal breath sounds. No wheezing or rales.  Abdominal:     Palpations: Abdomen is soft.     Tenderness: There is no abdominal tenderness.  Genitourinary:    Comments: Normal introitus and cervix Pap done Musculoskeletal:     Cervical back: Neck supple.     Right lower leg: No edema.     Left lower leg: No edema.  Lymphadenopathy:     Cervical: No cervical adenopathy.  Skin:    Findings: No rash.     Comments: No foot lesions  Neurological:     General: No focal deficit present.     Mental Status: She is alert and oriented to person, place, and time.     Comments: Normal sensation in feet  Psychiatric:        Mood and Affect: Mood normal.        Behavior: Behavior normal.            Assessment & Plan:

## 2023-11-03 NOTE — Assessment & Plan Note (Signed)
Healthy Pap today Due for mammogram--she will set up Yearly FIT--done this summer Starting exercise Counseled about cigarette cessation

## 2023-11-03 NOTE — Addendum Note (Signed)
Addended by: Eual Fines on: 11/03/2023 08:55 AM   Modules accepted: Orders

## 2023-11-04 LAB — CYTOLOGY - PAP
Comment: NEGATIVE
Diagnosis: NEGATIVE
High risk HPV: NEGATIVE

## 2023-11-25 ENCOUNTER — Other Ambulatory Visit: Payer: Self-pay | Admitting: Internal Medicine

## 2023-12-11 ENCOUNTER — Ambulatory Visit: Payer: BC Managed Care – PPO | Admitting: Internal Medicine

## 2023-12-11 ENCOUNTER — Encounter: Payer: Self-pay | Admitting: Internal Medicine

## 2023-12-11 ENCOUNTER — Telehealth: Payer: Self-pay

## 2023-12-11 ENCOUNTER — Ambulatory Visit (INDEPENDENT_AMBULATORY_CARE_PROVIDER_SITE_OTHER)
Admission: RE | Admit: 2023-12-11 | Discharge: 2023-12-11 | Disposition: A | Discharge status: Home / Self Care | Payer: BC Managed Care – PPO | Source: Ambulatory Visit | Attending: Internal Medicine | Admitting: Internal Medicine

## 2023-12-11 VITALS — BP 106/70 | HR 84 | Temp 98.4°F | Ht 64.0 in | Wt 239.0 lb

## 2023-12-11 DIAGNOSIS — S29019A Strain of muscle and tendon of unspecified wall of thorax, initial encounter: Secondary | ICD-10-CM | POA: Insufficient documentation

## 2023-12-11 DIAGNOSIS — S29019D Strain of muscle and tendon of unspecified wall of thorax, subsequent encounter: Secondary | ICD-10-CM

## 2023-12-11 NOTE — Telephone Encounter (Signed)
-----   Message from Tillman Abide sent at 12/11/2023  9:59 AM EST ----- Please call her to let her know---no fracture
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
## 2023-12-11 NOTE — Telephone Encounter (Signed)
Left message for pt to call office

## 2023-12-12 NOTE — Telephone Encounter (Signed)
 Spoke to pt

## 2024-01-16 ENCOUNTER — Telehealth: Payer: Self-pay

## 2024-01-16 NOTE — Telephone Encounter (Signed)
 Copied from CRM [id number]. Topic: General - Other >> Jan 16, 2024  4:26 PM Alcus Dad wrote: Reason for CRM: Patient's Dad has Flu and about to be admitted into the hospital. Patient needs Tamiflu for prevention. May not be able to answer so if possible update through MyChart

## 2024-01-19 NOTE — Telephone Encounter (Signed)
 Spoke to pt to apologize that this message came after Dr Alphonsus Sias and I had left for the weekend. She said she is fine and did not need it.

## 2024-03-09 ENCOUNTER — Other Ambulatory Visit (HOSPITAL_COMMUNITY): Payer: Self-pay

## 2024-03-09 ENCOUNTER — Telehealth: Payer: Self-pay | Admitting: Pharmacy Technician

## 2024-03-09 NOTE — Telephone Encounter (Signed)
 Pharmacy Patient Advocate Encounter  Received notification from [facility] that Prior Authorization for Ozempic  (0.25 or 0.5 MG/DOSE) 2MG /3ML pen-injectors has been APPROVED from 03/09/24 to 03/09/25   PA #/Case ID/Reference #: [id number]

## 2024-03-09 NOTE — Telephone Encounter (Signed)
 Pharmacy Patient Advocate Encounter   Received notification from CoverMyMeds that prior authorization for Ozempic  (0.25 or 0.5 MG/DOSE) 2MG /3ML pen-injectors is due for renewal.   Insurance verification completed.   The patient is insured through [facility].  Action: PA required; PA submitted to above mentioned insurance via CoverMyMeds Key/confirmation #/EOC [id number] Status is pending

## 2024-05-03 ENCOUNTER — Encounter: Payer: Self-pay | Admitting: Internal Medicine

## 2024-05-03 ENCOUNTER — Ambulatory Visit: Payer: BC Managed Care – PPO | Admitting: Internal Medicine

## 2024-05-03 VITALS — BP 110/78 | HR 74 | Temp 98.1°F | Ht 64.0 in | Wt 231.0 lb

## 2024-05-03 DIAGNOSIS — E119 Type 2 diabetes mellitus without complications: Secondary | ICD-10-CM

## 2024-05-03 DIAGNOSIS — I872 Venous insufficiency (chronic) (peripheral): Secondary | ICD-10-CM

## 2024-05-03 DIAGNOSIS — Z7985 Long-term (current) use of injectable non-insulin antidiabetic drugs: Secondary | ICD-10-CM

## 2024-05-03 LAB — POCT GLYCOSYLATED HEMOGLOBIN (HGB A1C): Hemoglobin A1C: 5.5 % (ref 4.0–5.6)

## 2024-05-03 MED ORDER — CYCLOBENZAPRINE HCL 5 MG PO TABS: 5.0000 mg | ORAL_TABLET | Freq: Every evening | ORAL | 1 refills | Status: DC | PRN

## 2024-05-03 NOTE — Addendum Note (Signed)
 Addended by: JIMMY ADE I on: 05/03/2024 09:34 AM   Modules accepted: Orders
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
## 2024-05-03 NOTE — Assessment & Plan Note (Signed)
 Uses support stockings at work and for travel

## 2024-05-03 NOTE — Assessment & Plan Note (Signed)
 Lab Results  Component Value Date   HGBA1C 5.5 05/03/2024   Excellent control on mounjaro 5mg  Going up to 7.5mg 

## 2024-05-28 ENCOUNTER — Encounter: Payer: Self-pay | Admitting: Advanced Practice Midwife

## 2024-06-23 ENCOUNTER — Other Ambulatory Visit: Payer: Self-pay | Admitting: Medical Genetics

## 2024-06-27 ENCOUNTER — Other Ambulatory Visit: Payer: Self-pay | Admitting: Internal Medicine

## 2024-07-15 ENCOUNTER — Other Ambulatory Visit
Admission: RE | Admit: 2024-07-15 | Discharge: 2024-07-15 | Disposition: A | Discharge status: Home / Self Care | Payer: Self-pay | Source: Ambulatory Visit | Attending: Medical Genetics | Admitting: Medical Genetics

## 2024-07-25 LAB — GENECONNECT MOLECULAR SCREEN: Genetic Analysis Overall Interpretation: NEGATIVE

## 2024-08-09 LAB — OPHTHALMOLOGY REPORT-SCANNED

## 2024-08-11 ENCOUNTER — Ambulatory Visit: Payer: Self-pay

## 2024-08-11 DIAGNOSIS — Z7985 Long-term (current) use of injectable non-insulin antidiabetic drugs: Secondary | ICD-10-CM

## 2024-11-08 ENCOUNTER — Telehealth: Payer: Self-pay

## 2024-11-08 ENCOUNTER — Encounter

## 2024-11-08 NOTE — Telephone Encounter (Signed)
 Spoke to pt. Looks like the first I could move people around on Dr Charlyn schedule was December 15, 2024. She was r/s to Dec 14, 2024 with [facility]. She is fine with it staying with [facility]. She is asking if labs could go ahead and be ordered and discuss at that OV in February. I told her I was not sure, but we can ask. I will forward the message to [facility].

## 2024-11-08 NOTE — Telephone Encounter (Signed)
"  Spoke to pt     "

## 2024-11-08 NOTE — Telephone Encounter (Signed)
 Copied from CRM #[id number]. Topic: Appointments - Scheduling Inquiry for Clinic >> Nov 08, 2024  8:31 AM Jeoffrey H wrote: Reason for CRM: Patient is a TOC from Dr. Jimmy. She does not have an appt time until 12/14/2024 due to her reschd her appt that was for today. She wanted to know if she could come in sooner to get diabetic labs/kidney function, A1C, liver labs, full blood panel.    [phone]

## 2024-12-14 ENCOUNTER — Telehealth: Admitting: General Practice

## 2024-12-14 ENCOUNTER — Encounter: Payer: Self-pay | Admitting: General Practice

## 2024-12-14 DIAGNOSIS — Z1211 Encounter for screening for malignant neoplasm of colon: Secondary | ICD-10-CM

## 2024-12-14 DIAGNOSIS — E559 Vitamin D deficiency, unspecified: Secondary | ICD-10-CM | POA: Insufficient documentation

## 2024-12-14 DIAGNOSIS — Z7689 Persons encountering health services in other specified circumstances: Secondary | ICD-10-CM | POA: Insufficient documentation

## 2024-12-14 DIAGNOSIS — Z803 Family history of malignant neoplasm of breast: Secondary | ICD-10-CM | POA: Insufficient documentation

## 2024-12-14 DIAGNOSIS — E785 Hyperlipidemia, unspecified: Secondary | ICD-10-CM

## 2024-12-14 DIAGNOSIS — Z7985 Long-term (current) use of injectable non-insulin antidiabetic drugs: Secondary | ICD-10-CM

## 2024-12-14 DIAGNOSIS — F17210 Nicotine dependence, cigarettes, uncomplicated: Secondary | ICD-10-CM

## 2024-12-14 DIAGNOSIS — Z1231 Encounter for screening mammogram for malignant neoplasm of breast: Secondary | ICD-10-CM

## 2024-12-14 DIAGNOSIS — E1169 Type 2 diabetes mellitus with other specified complication: Secondary | ICD-10-CM | POA: Insufficient documentation

## 2024-12-14 NOTE — Patient Instructions (Addendum)
 Schedule lab ONLY appointment. Please come fasting for four prior to appointment. You are allowed water and black coffee.    Call and schedule mammogram.   Cologuard kit ordered; please complete it when you receive it in the mail.  Keep your scheduled physical appt on 12/30/24.   It was a pleasure to meet you today! Please don't hesitate to contact me with any questions. Welcome to [facility]!

## 2024-12-15 ENCOUNTER — Other Ambulatory Visit

## 2024-12-15 DIAGNOSIS — E119 Type 2 diabetes mellitus without complications: Secondary | ICD-10-CM

## 2024-12-15 DIAGNOSIS — E559 Vitamin D deficiency, unspecified: Secondary | ICD-10-CM

## 2024-12-15 DIAGNOSIS — E1169 Type 2 diabetes mellitus with other specified complication: Secondary | ICD-10-CM

## 2024-12-15 LAB — LIPID PANEL
Cholesterol: 186 mg/dL (ref 28–200)
HDL: 30.1 mg/dL — ABNORMAL LOW
LDL Cholesterol: 120 mg/dL — ABNORMAL HIGH (ref 10–99)
NonHDL: 156.26
Total CHOL/HDL Ratio: 6
Triglycerides: 181 mg/dL — ABNORMAL HIGH (ref 10.0–149.0)
VLDL: 36.2 mg/dL (ref 0.0–40.0)

## 2024-12-15 LAB — HEMOGLOBIN A1C: Hgb A1c MFr Bld: 5.4 % (ref 4.6–6.5)

## 2024-12-15 LAB — CBC
HCT: 45.8 % (ref 36.0–46.0)
Hemoglobin: 15.7 g/dL — ABNORMAL HIGH (ref 12.0–15.0)
MCHC: 34.1 g/dL (ref 30.0–36.0)
MCV: 92.3 fl (ref 78.0–100.0)
Platelets: 255 10*3/uL (ref 150.0–400.0)
RBC: 4.96 Mil/uL (ref 3.87–5.11)
RDW: 12.6 % (ref 11.5–15.5)
WBC: 6.6 10*3/uL (ref 4.0–10.5)

## 2024-12-15 LAB — COMPREHENSIVE METABOLIC PANEL WITH GFR
ALT: 11 U/L (ref 3–35)
AST: 11 U/L (ref 5–37)
Albumin: 4.2 g/dL (ref 3.5–5.2)
Alkaline Phosphatase: 71 U/L (ref 39–117)
BUN: 11 mg/dL (ref 6–23)
CO2: 28 meq/L (ref 19–32)
Calcium: 9.2 mg/dL (ref 8.4–10.5)
Chloride: 103 meq/L (ref 96–112)
Creatinine, Ser: 0.78 mg/dL (ref 0.40–1.20)
GFR: 86.6 mL/min
Glucose, Bld: 104 mg/dL — ABNORMAL HIGH (ref 70–99)
Potassium: 4 meq/L (ref 3.5–5.1)
Sodium: 139 meq/L (ref 135–145)
Total Bilirubin: 0.5 mg/dL (ref 0.2–1.2)
Total Protein: 6.7 g/dL (ref 6.0–8.3)

## 2024-12-15 LAB — MICROALBUMIN / CREATININE URINE RATIO
Creatinine,U: 73.9 mg/dL
Microalb Creat Ratio: 11 mg/g (ref 0.0–30.0)
Microalb, Ur: 0.8 mg/dL (ref 0.7–1.9)

## 2024-12-15 LAB — TSH: TSH: 0.74 u[IU]/mL (ref 0.35–5.50)

## 2024-12-15 LAB — VITAMIN D 25 HYDROXY (VIT D DEFICIENCY, FRACTURES): VITD: 46.7 ng/mL (ref 30.00–100.00)

## 2024-12-16 ENCOUNTER — Ambulatory Visit: Payer: Self-pay | Admitting: General Practice

## 2024-12-30 ENCOUNTER — Encounter: Admitting: General Practice
